# Patient Record
Sex: Female | Born: 1955 | Race: White | Hispanic: No | Marital: Married | Smoking: Former smoker
Health system: Southern US, Community
[De-identification: ages and names within clinical notes are randomized; demographics above are authoritative.]

## PROBLEM LIST (undated history)

## (undated) DIAGNOSIS — M67912 Unspecified disorder of synovium and tendon, left shoulder: Secondary | ICD-10-CM

## (undated) DIAGNOSIS — D649 Anemia, unspecified: Secondary | ICD-10-CM

## (undated) DIAGNOSIS — G2581 Restless legs syndrome: Secondary | ICD-10-CM

## (undated) DIAGNOSIS — K219 Gastro-esophageal reflux disease without esophagitis: Secondary | ICD-10-CM

## (undated) DIAGNOSIS — R9082 White matter disease, unspecified: Secondary | ICD-10-CM

## (undated) DIAGNOSIS — D699 Hemorrhagic condition, unspecified: Secondary | ICD-10-CM

## (undated) DIAGNOSIS — I10 Essential (primary) hypertension: Secondary | ICD-10-CM

## (undated) DIAGNOSIS — Z972 Presence of dental prosthetic device (complete) (partial): Secondary | ICD-10-CM

## (undated) DIAGNOSIS — T4145XA Adverse effect of unspecified anesthetic, initial encounter: Secondary | ICD-10-CM

## (undated) DIAGNOSIS — IMO0001 Reserved for inherently not codable concepts without codable children: Secondary | ICD-10-CM

## (undated) DIAGNOSIS — I2510 Atherosclerotic heart disease of native coronary artery without angina pectoris: Secondary | ICD-10-CM

## (undated) DIAGNOSIS — M1990 Unspecified osteoarthritis, unspecified site: Secondary | ICD-10-CM

## (undated) DIAGNOSIS — T8859XA Other complications of anesthesia, initial encounter: Secondary | ICD-10-CM

## (undated) DIAGNOSIS — R42 Dizziness and giddiness: Secondary | ICD-10-CM

## (undated) HISTORY — PX: TUBAL LIGATION: SHX77

## (undated) HISTORY — DX: White matter disease, unspecified: R90.82

## (undated) HISTORY — PX: CHOLECYSTECTOMY: SHX55

## (undated) HISTORY — DX: Essential (primary) hypertension: I10

## (undated) HISTORY — PX: FACIAL COSMETIC SURGERY: SHX629

## (undated) HISTORY — DX: Restless legs syndrome: G25.81

## (undated) HISTORY — DX: Anemia, unspecified: D64.9

## (undated) HISTORY — PX: FOOT SURGERY: SHX648

## (undated) HISTORY — DX: Gastro-esophageal reflux disease without esophagitis: K21.9

## (undated) HISTORY — PX: CARDIAC CATHETERIZATION: SHX172

## (undated) HISTORY — PX: RECTAL PROLAPSE REPAIR: SHX759

## (undated) HISTORY — DX: Reserved for inherently not codable concepts without codable children: IMO0001

---

## 2003-06-20 HISTORY — PX: GASTRIC BYPASS: SHX52

## 2003-11-05 ENCOUNTER — Other Ambulatory Visit: Payer: Self-pay

## 2004-05-08 ENCOUNTER — Emergency Department: Payer: Self-pay | Admitting: Emergency Medicine

## 2004-11-22 ENCOUNTER — Emergency Department: Payer: Self-pay | Admitting: Emergency Medicine

## 2005-06-09 ENCOUNTER — Ambulatory Visit: Payer: Self-pay | Admitting: Gastroenterology

## 2005-07-19 ENCOUNTER — Ambulatory Visit: Payer: Self-pay | Admitting: Anesthesiology

## 2005-08-13 ENCOUNTER — Emergency Department: Payer: Self-pay | Admitting: Emergency Medicine

## 2005-09-28 ENCOUNTER — Other Ambulatory Visit: Payer: Self-pay

## 2005-09-28 ENCOUNTER — Emergency Department: Payer: Self-pay | Admitting: Internal Medicine

## 2005-12-27 ENCOUNTER — Ambulatory Visit: Payer: Self-pay | Admitting: Anesthesiology

## 2006-06-14 ENCOUNTER — Ambulatory Visit: Payer: Self-pay | Admitting: Gastroenterology

## 2006-12-20 ENCOUNTER — Ambulatory Visit: Payer: Self-pay | Admitting: Internal Medicine

## 2007-09-03 ENCOUNTER — Ambulatory Visit: Payer: Self-pay | Admitting: Gastroenterology

## 2007-09-24 ENCOUNTER — Ambulatory Visit: Payer: Self-pay | Admitting: Unknown Physician Specialty

## 2007-10-07 ENCOUNTER — Ambulatory Visit: Payer: Self-pay | Admitting: Unknown Physician Specialty

## 2007-11-13 ENCOUNTER — Other Ambulatory Visit: Payer: Self-pay

## 2007-11-13 ENCOUNTER — Emergency Department: Payer: Self-pay | Admitting: Internal Medicine

## 2007-11-15 ENCOUNTER — Ambulatory Visit: Payer: Self-pay | Admitting: Internal Medicine

## 2008-06-19 HISTORY — PX: STOMACH SURGERY: SHX791

## 2008-09-30 ENCOUNTER — Ambulatory Visit: Payer: Self-pay | Admitting: Internal Medicine

## 2009-10-04 ENCOUNTER — Ambulatory Visit: Payer: Self-pay | Admitting: Internal Medicine

## 2010-12-02 ENCOUNTER — Ambulatory Visit: Payer: Self-pay | Admitting: Internal Medicine

## 2011-01-04 ENCOUNTER — Ambulatory Visit: Payer: Self-pay | Admitting: Neurology

## 2011-01-16 ENCOUNTER — Inpatient Hospital Stay: Payer: Self-pay | Admitting: Internal Medicine

## 2011-01-18 ENCOUNTER — Ambulatory Visit: Payer: Self-pay | Admitting: Internal Medicine

## 2011-01-25 ENCOUNTER — Ambulatory Visit: Payer: Self-pay | Admitting: Internal Medicine

## 2011-02-18 ENCOUNTER — Ambulatory Visit: Payer: Self-pay | Admitting: Internal Medicine

## 2011-03-22 ENCOUNTER — Ambulatory Visit: Payer: Self-pay | Admitting: Internal Medicine

## 2011-04-20 ENCOUNTER — Ambulatory Visit: Payer: Self-pay | Admitting: Internal Medicine

## 2011-05-16 ENCOUNTER — Ambulatory Visit: Payer: Self-pay | Admitting: Internal Medicine

## 2011-05-20 ENCOUNTER — Ambulatory Visit: Payer: Self-pay | Admitting: Internal Medicine

## 2011-06-26 ENCOUNTER — Ambulatory Visit: Payer: Self-pay | Admitting: Sports Medicine

## 2011-10-03 ENCOUNTER — Ambulatory Visit: Payer: Self-pay | Admitting: Internal Medicine

## 2011-10-03 LAB — CBC CANCER CENTER
Basophil %: 0.4 %
Eosinophil #: 0 x10 3/mm (ref 0.0–0.7)
Lymphocyte %: 28 %
MCH: 32.6 pg (ref 26.0–34.0)
MCV: 98 fL (ref 80–100)
Monocyte %: 7.1 %
Neutrophil #: 4 x10 3/mm (ref 1.4–6.5)
Neutrophil %: 63.9 %
Platelet: 219 x10 3/mm (ref 150–440)
RBC: 4.07 10*6/uL (ref 3.80–5.20)

## 2011-10-03 LAB — IRON AND TIBC: Iron Saturation: 59 %

## 2011-10-03 LAB — FERRITIN: Ferritin (ARMC): 19 ng/mL (ref 8–388)

## 2011-10-18 ENCOUNTER — Ambulatory Visit: Payer: Self-pay | Admitting: Internal Medicine

## 2011-12-15 ENCOUNTER — Ambulatory Visit: Payer: Self-pay | Admitting: Internal Medicine

## 2011-12-22 ENCOUNTER — Ambulatory Visit: Payer: Self-pay | Admitting: Internal Medicine

## 2012-05-24 ENCOUNTER — Ambulatory Visit: Payer: Self-pay | Admitting: Internal Medicine

## 2012-05-30 ENCOUNTER — Ambulatory Visit: Payer: Self-pay | Admitting: Internal Medicine

## 2012-07-18 ENCOUNTER — Ambulatory Visit: Payer: Self-pay | Admitting: Otolaryngology

## 2012-07-26 ENCOUNTER — Ambulatory Visit: Payer: Self-pay | Admitting: Internal Medicine

## 2012-09-12 ENCOUNTER — Ambulatory Visit: Payer: Self-pay | Admitting: Internal Medicine

## 2012-09-12 LAB — CBC CANCER CENTER
Basophil #: 0 x10 3/mm (ref 0.0–0.1)
Basophil %: 0.8 %
Eosinophil #: 0.1 x10 3/mm (ref 0.0–0.7)
HGB: 13.4 g/dL (ref 12.0–16.0)
Lymphocyte #: 1.7 x10 3/mm (ref 1.0–3.6)
Lymphocyte %: 31 %
MCH: 31.6 pg (ref 26.0–34.0)
MCHC: 33 g/dL (ref 32.0–36.0)
Neutrophil #: 3.2 x10 3/mm (ref 1.4–6.5)
Neutrophil %: 57.4 %
Platelet: 197 x10 3/mm (ref 150–440)
RBC: 4.25 10*6/uL (ref 3.80–5.20)
WBC: 5.6 x10 3/mm (ref 3.6–11.0)

## 2012-09-12 LAB — FERRITIN: Ferritin (ARMC): 12 ng/mL

## 2012-09-12 LAB — IRON AND TIBC
Iron Bind.Cap.(Total): 293 ug/dL
Iron Saturation: 42 %
Iron: 122 ug/dL
Unbound Iron-Bind.Cap.: 171 ug/dL

## 2012-09-17 ENCOUNTER — Ambulatory Visit: Payer: Self-pay | Admitting: Internal Medicine

## 2013-01-03 ENCOUNTER — Ambulatory Visit: Payer: Self-pay | Admitting: Gastroenterology

## 2013-02-18 ENCOUNTER — Ambulatory Visit: Payer: Self-pay | Admitting: Specialist

## 2013-08-02 ENCOUNTER — Emergency Department: Payer: Self-pay | Admitting: Emergency Medicine

## 2013-08-02 LAB — PROTIME-INR
INR: 1
PROTHROMBIN TIME: 12.7 s (ref 11.5–14.7)

## 2013-08-02 LAB — CBC
HCT: 36.8 % (ref 35.0–47.0)
HGB: 12.2 g/dL (ref 12.0–16.0)
MCH: 30.5 pg (ref 26.0–34.0)
MCHC: 33.3 g/dL (ref 32.0–36.0)
MCV: 92 fL (ref 80–100)
PLATELETS: 203 10*3/uL (ref 150–440)
RBC: 4.01 10*6/uL (ref 3.80–5.20)
RDW: 13.6 % (ref 11.5–14.5)
WBC: 5.5 10*3/uL (ref 3.6–11.0)

## 2013-08-02 LAB — BASIC METABOLIC PANEL
ANION GAP: 4 — AB (ref 7–16)
BUN: 12 mg/dL (ref 7–18)
CALCIUM: 9 mg/dL (ref 8.5–10.1)
CO2: 28 mmol/L (ref 21–32)
Chloride: 108 mmol/L — ABNORMAL HIGH (ref 98–107)
Creatinine: 1.01 mg/dL (ref 0.60–1.30)
EGFR (African American): >60
Glucose: 89 mg/dL (ref 65–99)
Osmolality: 279 (ref 275–301)
POTASSIUM: 3.9 mmol/L (ref 3.5–5.1)
SODIUM: 140 mmol/L (ref 136–145)

## 2013-08-02 LAB — TROPONIN I
Troponin-I: <0.02 ng/mL
Troponin-I: <0.02 ng/mL

## 2013-08-02 LAB — APTT: Activated PTT: 30.2 secs (ref 23.6–35.9)

## 2013-08-21 ENCOUNTER — Ambulatory Visit: Payer: Self-pay | Admitting: Internal Medicine

## 2013-08-21 LAB — IRON AND TIBC
IRON BIND. CAP.(TOTAL): 348 ug/dL (ref 250–450)
Iron Saturation: 22 %
Iron: 76 ug/dL (ref 50–170)
UNBOUND IRON-BIND. CAP.: 272 ug/dL

## 2013-08-21 LAB — CBC CANCER CENTER
Basophil #: 0 x10 3/mm (ref 0.0–0.1)
Basophil %: 0.7 %
EOS ABS: 0 x10 3/mm (ref 0.0–0.7)
EOS PCT: 0.4 %
HCT: 39.2 % (ref 35.0–47.0)
HGB: 12.9 g/dL (ref 12.0–16.0)
LYMPHS PCT: 29.9 %
Lymphocyte #: 1.9 x10 3/mm (ref 1.0–3.6)
MCH: 29.8 pg (ref 26.0–34.0)
MCHC: 33 g/dL (ref 32.0–36.0)
MCV: 90 fL (ref 80–100)
Monocyte #: 0.5 x10 3/mm (ref 0.2–0.9)
Monocyte %: 7.5 %
NEUTROS PCT: 61.5 %
Neutrophil #: 3.9 x10 3/mm (ref 1.4–6.5)
PLATELETS: 286 x10 3/mm (ref 150–440)
RBC: 4.35 10*6/uL (ref 3.80–5.20)
RDW: 13.3 % (ref 11.5–14.5)
WBC: 6.4 x10 3/mm (ref 3.6–11.0)

## 2013-08-21 LAB — FERRITIN: FERRITIN (ARMC): 7 ng/mL — AB (ref 8–388)

## 2013-09-17 ENCOUNTER — Ambulatory Visit: Payer: Self-pay | Admitting: Internal Medicine

## 2014-01-15 ENCOUNTER — Ambulatory Visit: Payer: Self-pay | Admitting: Internal Medicine

## 2014-01-15 LAB — IRON AND TIBC
IRON SATURATION: 56 %
IRON: 164 ug/dL (ref 50–170)
Iron Bind.Cap.(Total): 291 ug/dL (ref 250–450)
Unbound Iron-Bind.Cap.: 127 ug/dL

## 2014-01-15 LAB — FERRITIN: Ferritin (ARMC): 15 ng/mL (ref 8–388)

## 2014-01-17 ENCOUNTER — Ambulatory Visit: Payer: Self-pay | Admitting: Internal Medicine

## 2014-01-20 DIAGNOSIS — F410 Panic disorder [episodic paroxysmal anxiety] without agoraphobia: Secondary | ICD-10-CM | POA: Insufficient documentation

## 2014-01-20 DIAGNOSIS — I2510 Atherosclerotic heart disease of native coronary artery without angina pectoris: Secondary | ICD-10-CM | POA: Insufficient documentation

## 2014-02-14 DIAGNOSIS — D510 Vitamin B12 deficiency anemia due to intrinsic factor deficiency: Secondary | ICD-10-CM | POA: Insufficient documentation

## 2014-02-14 DIAGNOSIS — K219 Gastro-esophageal reflux disease without esophagitis: Secondary | ICD-10-CM | POA: Insufficient documentation

## 2014-02-14 DIAGNOSIS — G2581 Restless legs syndrome: Secondary | ICD-10-CM | POA: Insufficient documentation

## 2014-08-20 ENCOUNTER — Other Ambulatory Visit: Payer: Self-pay | Admitting: *Deleted

## 2014-08-20 ENCOUNTER — Encounter: Payer: Self-pay | Admitting: *Deleted

## 2014-08-20 ENCOUNTER — Ambulatory Visit (INDEPENDENT_AMBULATORY_CARE_PROVIDER_SITE_OTHER): Payer: BLUE CROSS/BLUE SHIELD

## 2014-08-20 ENCOUNTER — Encounter: Payer: Self-pay | Admitting: Podiatry

## 2014-08-20 ENCOUNTER — Ambulatory Visit (INDEPENDENT_AMBULATORY_CARE_PROVIDER_SITE_OTHER): Payer: BLUE CROSS/BLUE SHIELD | Admitting: Podiatry

## 2014-08-20 VITALS — BP 148/94 | HR 62 | Resp 16 | Ht 66.0 in | Wt 170.0 lb

## 2014-08-20 DIAGNOSIS — S9031XA Contusion of right foot, initial encounter: Secondary | ICD-10-CM | POA: Diagnosis not present

## 2014-08-20 DIAGNOSIS — S92911A Unspecified fracture of right toe(s), initial encounter for closed fracture: Secondary | ICD-10-CM | POA: Diagnosis not present

## 2014-08-20 MED ORDER — ACETAMINOPHEN-CODEINE #3 300-30 MG PO TABS
1.0000 | ORAL_TABLET | Freq: Three times a day (TID) | ORAL | Status: DC | PRN
Start: 2014-08-20 — End: 2017-07-18

## 2014-08-20 NOTE — Patient Instructions (Signed)

## 2014-08-20 NOTE — Progress Notes (Signed)
   Subjective:    Patient ID: Brittney Stephens, female    DOB: [birth year], 59 y.o.   MRN: [id number]  HPI Comments: 59 year old female presents the office today with complaints of right third toe pain. She states that she hit her right foot on the corner of a wall which resulted in pain to the third toe. She states that after she had immediate pain to the third toe. She states that she then developed pain over other areas of her foot. She states the areas swollen particularly of the right third toe. She has been taping the toe without much improvement. She states that she continues to have pain to the right foot. She denies any redness or any increase in warmth. On the way out she was stating that she had some discomfort to the left foot particularly the heel intermittently. She states that she does not have any pain currently at this time and she denies any history of injury or trauma. She denies any swelling or redness overlying the area. No other complaints at this time.      Review of Systems  All other systems reviewed and are negative.      Objective:   Physical Exam AAO x3, NAD DP/PT pulses palpable bilaterally, CRT less than 3 seconds Protective sensation intact with Simms Weinstein monofilament, vibratory sensation intact, Achilles tendon reflex intact There is tenderness palpation overlying the right third toe and there is edema to the digit. There is also tenderness palpation upon the second and third metatarsal. There is mild edema overlying the third MTPJ and the third digit. There is no overlying erythema or increase in warmth. There is no other areas of pinpoint bony tenderness to bilateral lower extremities. On the left side is no tenderness palpation overlying the calcaneus or along the course for insertion of the plantar fascia. No other areas of edema, erythema, increase in warmth to bilateral lower extremities. MMT 5/5, ROM WNL.  No open lesions bilaterally. On the plantar  aspect the left heel there is a small hyperkeratotic lesion along the medial aspect. Upon debridement there is no underlying ulceration, drainage or other clinical signs of infection. Scar overlying the right foot from prior surgery from prior bunionectomy which is well-healed. No pain with calf compression, swelling, warmth, erythema bilaterally.        Assessment & Plan:  59 year old female with right third toe fracture, foot contusion -X-rays were obtained of the right foot and discussed with the patient. There is a fracture of the third digit. -Treatment options were discussed with the patient include alternatives, risks, complications. -Due to the amount of discomfort we'll place the patient to a surgical shoe.  -Continue ice and elevation. -Prescribed Tylenol No. 3 due to pain. -Left plantar medial hyperkeratotic lesion sharply debrided without complication/bleeding. If the area remains symptomatic we'll discuss further treatment at next appointment. -Follow-up in 3 weeks or sooner if any problems are to arise. In the meantime, encouraged call the office with any questions, concerns, change in symptoms. Will x-ray right foot next appointment.

## 2014-09-10 ENCOUNTER — Ambulatory Visit: Payer: Self-pay | Admitting: Podiatry

## 2014-10-09 NOTE — Op Note (Signed)
DATE OF BIRTH:  [birth year]  DATE OF PROCEDURE:  07/18/2012  SURGEON:  Janalee Dane, MD    PREOPERATIVE DIAGNOSIS:  Aging face syndrome.   POSTOPERATIVE DIAGNOSIS:  Aging face syndrome.   PROCEDURE:  Combination laser procedure (profractional and micro laser peel).   DESCRIPTION OF THE PROCEDURE:  The patient was placed in the supine position on the operating room table. After general endotracheal anesthesia had been induced, the patient was turned 180 degrees from anesthesia, and the face was anesthetized with blocks at the infraorbital, mental, lateral orbital, premastoid, Erb's point. The face was then prepped with alcohol. After allowing the alcohol to dry, the profractional and MLP were used in the standard fashion using the settings as described in the diagram. The patient tolerated the procedure well. Her face was dressed with Aquaphor. She was allowed to emerge from anesthesia, extubated and taken to the recovery room in stable condition. There were no complications.   ESTIMATED BLOOD LOSS:  Less than 10 mL.    ____________________________ J. Nadeen Landau, MD jmc:ms D: 07/18/2012 20:46:13 ET T: 07/18/2012 23:04:20 ET JOB#: [id number]  cc: Janalee Dane, MD, <Dictator> Nicholos Johns MD ELECTRONICALLY SIGNED 07/30/2012 19:31

## 2015-07-21 DIAGNOSIS — K580 Irritable bowel syndrome with diarrhea: Secondary | ICD-10-CM | POA: Insufficient documentation

## 2015-07-21 DIAGNOSIS — D692 Other nonthrombocytopenic purpura: Secondary | ICD-10-CM | POA: Insufficient documentation

## 2015-07-21 DIAGNOSIS — I2510 Atherosclerotic heart disease of native coronary artery without angina pectoris: Secondary | ICD-10-CM | POA: Insufficient documentation

## 2016-01-25 ENCOUNTER — Emergency Department: Payer: BLUE CROSS/BLUE SHIELD

## 2016-01-25 ENCOUNTER — Inpatient Hospital Stay
Admission: EM | Admit: 2016-01-25 | Discharge: 2016-01-27 | DRG: 512 | Disposition: A | Discharge status: Home / Self Care | Payer: BLUE CROSS/BLUE SHIELD | Attending: Orthopedic Surgery | Admitting: Orthopedic Surgery

## 2016-01-25 DIAGNOSIS — I10 Essential (primary) hypertension: Secondary | ICD-10-CM | POA: Diagnosis present

## 2016-01-25 DIAGNOSIS — K219 Gastro-esophageal reflux disease without esophagitis: Secondary | ICD-10-CM | POA: Diagnosis present

## 2016-01-25 DIAGNOSIS — Y92008 Other place in unspecified non-institutional (private) residence as the place of occurrence of the external cause: Secondary | ICD-10-CM

## 2016-01-25 DIAGNOSIS — Z9889 Other specified postprocedural states: Secondary | ICD-10-CM

## 2016-01-25 DIAGNOSIS — S52502B Unspecified fracture of the lower end of left radius, initial encounter for open fracture type I or II: Secondary | ICD-10-CM | POA: Diagnosis present

## 2016-01-25 DIAGNOSIS — I2510 Atherosclerotic heart disease of native coronary artery without angina pectoris: Secondary | ICD-10-CM | POA: Diagnosis present

## 2016-01-25 DIAGNOSIS — Z79899 Other long term (current) drug therapy: Secondary | ICD-10-CM

## 2016-01-25 DIAGNOSIS — Z8781 Personal history of (healed) traumatic fracture: Secondary | ICD-10-CM

## 2016-01-25 DIAGNOSIS — D510 Vitamin B12 deficiency anemia due to intrinsic factor deficiency: Secondary | ICD-10-CM | POA: Diagnosis present

## 2016-01-25 DIAGNOSIS — W109XXA Fall (on) (from) unspecified stairs and steps, initial encounter: Secondary | ICD-10-CM | POA: Diagnosis present

## 2016-01-25 DIAGNOSIS — S52509A Unspecified fracture of the lower end of unspecified radius, initial encounter for closed fracture: Secondary | ICD-10-CM | POA: Diagnosis present

## 2016-01-25 DIAGNOSIS — Z87891 Personal history of nicotine dependence: Secondary | ICD-10-CM

## 2016-01-25 DIAGNOSIS — W19XXXA Unspecified fall, initial encounter: Secondary | ICD-10-CM

## 2016-01-25 DIAGNOSIS — S52612B Displaced fracture of left ulna styloid process, initial encounter for open fracture type I or II: Secondary | ICD-10-CM | POA: Diagnosis present

## 2016-01-25 MED ORDER — ZOLPIDEM TARTRATE 5 MG PO TABS: 5.0000 mg | ORAL_TABLET | Freq: Every evening | ORAL | Status: DC | PRN

## 2016-01-25 MED ORDER — SODIUM CHLORIDE 0.9 % IV SOLN
INTRAVENOUS | Status: DC
  Administered 2016-01-26 (×2): via INTRAVENOUS

## 2016-01-25 MED ORDER — CEFAZOLIN SODIUM-DEXTROSE 2-4 GM/100ML-% IV SOLN
2.0000 g | Freq: Three times a day (TID) | INTRAVENOUS | Status: DC
  Administered 2016-01-26 – 2016-01-27 (×4): 2 g via INTRAVENOUS
  Filled 2016-01-25 (×7): qty 100

## 2016-01-25 MED ORDER — OXYCODONE-ACETAMINOPHEN 5-325 MG PO TABS: 1.0000 | ORAL_TABLET | ORAL | Status: DC | PRN

## 2016-01-25 MED ORDER — MORPHINE SULFATE (PF) 2 MG/ML IV SOLN
2.0000 mg | INTRAVENOUS | Status: DC | PRN
Start: 2016-01-25 — End: 2016-01-26
  Administered 2016-01-25 (×2): 2 mg via INTRAVENOUS
  Filled 2016-01-25 (×2): qty 1

## 2016-01-25 MED ORDER — HYDROMORPHONE HCL 1 MG/ML IJ SOLN
INTRAMUSCULAR | Status: AC
  Administered 2016-01-26: 0.5 mg
  Filled 2016-01-25: qty 1

## 2016-01-25 MED ORDER — ONDANSETRON HCL 4 MG/2ML IJ SOLN
4.0000 mg | Freq: Four times a day (QID) | INTRAMUSCULAR | Status: DC | PRN
  Administered 2016-01-25 – 2016-01-26 (×4): 4 mg via INTRAVENOUS
  Filled 2016-01-25 (×3): qty 2

## 2016-01-25 MED ORDER — ONDANSETRON HCL 4 MG PO TABS
4.0000 mg | ORAL_TABLET | Freq: Four times a day (QID) | ORAL | Status: DC | PRN
Start: 2016-01-25 — End: 2016-01-27

## 2016-01-25 MED ORDER — OXYCODONE-ACETAMINOPHEN 5-325 MG PO TABS
2.0000 | ORAL_TABLET | Freq: Once | ORAL | Status: AC
  Administered 2016-01-25: 2 via ORAL
  Filled 2016-01-25: qty 2

## 2016-01-25 NOTE — ED Triage Notes (Signed)
Pt states that she has about a glass and a half of wine tonight and was walking down back porch.  Pt reports that she tripped in her flip flops down 3 stairs and fell onto concrete.  Per EMS, L wrist deformed and a puncture wound noted at scene.  Area bleeding.  Pt denies LOC and denies hitting head.

## 2016-01-25 NOTE — ED Notes (Signed)
Notified EDP of pain level and requesting pain medication for patient.  EDP gave no verbal orders at this time.

## 2016-01-25 NOTE — ED Notes (Signed)
All jewelry has been removed from affected hand

## 2016-01-25 NOTE — ED Notes (Signed)
Pt was transported to room 156. Staff on 1A notified of pt arrival

## 2016-01-25 NOTE — H&P (Signed)
Subjective:   Patient is a 60 y.o. female presents with eft wrist pain and deformity a fall at home while getting out of her back porch. She landed on her outstretched left hand had immediate pain in her family reported one was sticking out,out EMS reduced this wrapped with sterile dressing and then applied splint. She brought to the emergency room and evaluated with x-rays.  She will not let me remove the splint to check the wound because of severe pain. She is having a little bit of tingling into her hand.Right-hand-dominant. she had eaten shortly before the fall  Patient Active Problem List   Diagnosis Date Noted  . Distal radius fracture 01/25/2016  . 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 Acid reflux 02/14/2014  . Restless leg 02/14/2014  . Atherosclerosis of coronary artery 01/20/2014  . 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 Anemia   . Hypertension   . Reflux   . RLS (restless legs syndrome)     Past Surgical History:  Procedure Laterality Date  . FACIAL COSMETIC SURGERY    . FOOT SURGERY       (Not in a hospital admission) Allergies  Allergen Reactions  . Azithromycin Anaphylaxis    Other reaction(s): UNKNOWN  . Erythromycin Other (See Comments) and Shortness Of Breath    Other Reaction: Other reaction  . Iodine Anaphylaxis    Other reaction(s): RASH  . Shellfish Allergy Anaphylaxis    Other reaction(s): UNKNOWN    Social History  Substance Use Topics  . Smoking status: Former Research scientist (life sciences)  . Smokeless tobacco: Never Used     Comment: quit 5 years ago   . Alcohol use No    No family history on file.  Review of Systems Pertinent items are noted in HPI.  Objective:   Patient Vitals for the past 8 hrs:  BP Temp Temp src Pulse Resp SpO2 Height Weight  01/25/16 2050 (!) 166/93 98.2 F (36.8 C) Oral 91 18 97 % '5\' 6"'$  (1.676 m) 83.9 kg (185 lb)   No intake/output data recorded. No intake/output data recorded.    BP (!)  166/93   Pulse 91   Temp 98.2 F (36.8 C) (Oral)   Resp 18   Ht '5\' 6"'$  (1.676 m)   Wt 83.9 kg (185 lb)   SpO2 97%   BMI 29.86 kg/m  General appearance: alert, cooperative and moderate distress Head: Normocephalic, without obvious abnormality, atraumatic Lungs: clear to auscultation bilaterally Heart: regular rate and rhythm, S1, S2 normal, no murmur, click, rub or gallop Extremities: left wrist is in a EMS splint with dorsal dressing with a small amount of blood visible. She has brisk capillary refill.   Data Review x-rays reveal comminuted fracture volarly displaced distal radius fracture with minimally displaced distal ulna fracture  Assessment:   Active Problems:   Distal radius fracture   Plan:   ADMIT TO [facility] ORIF tomorrow after she has been npo Medical evaluation prior to surgery because of cardiac history

## 2016-01-25 NOTE — ED Notes (Signed)
$'100mg'X$  fentanyl given at approx 830pm by EMS per husband's estimation.

## 2016-01-25 NOTE — ED Provider Notes (Signed)
[facility] Emergency Department Provider Note  ____________________________________________  Time seen: Approximately 9:00 PM  I have reviewed the triage vital signs and the nursing notes.   HISTORY  Chief Complaint Fall    HPI Brittney Stephens is a 60 y.o. female , NAD, in severe pain, presents to the emergency department via EMS with left wrist pain. Patient is accompanied by her husband to assist with history. States the patient was walking down steps of her back deck when her flip flop broke and caused her to fall onto the concrete. States she put her arms out to brace for the fall. Noted severe pain in the left wrist and noted "a bone hanging out" with bleeding. Also notes some pain in the left elbow since being transported by EMS. Patient denies head injury, LOC, dizziness. EMS arrived to the scene and applied a splint as well as gave him 100 g of fentanyl as well as an antinausea medication. Patient denies any previous injuries to the left upper extremity.   Past Medical History:  Diagnosis Date  . Anemia   . Hypertension   . Reflux   . RLS (restless legs syndrome)     Patient Active Problem List   Diagnosis Date Noted  . Distal radius fracture 01/25/2016  . 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 Acid reflux 02/14/2014  . Restless leg 02/14/2014  . Atherosclerosis of coronary artery 01/20/2014  . Episodic paroxysmal anxiety disorder 01/20/2014    Past Surgical History:  Procedure Laterality Date  . FACIAL COSMETIC SURGERY    . FOOT SURGERY      Prior to Admission medications   Medication Sig Start Date End Date Taking? Authorizing Provider  acetaminophen-codeine (TYLENOL #3) 300-30 MG per tablet Take 1 tablet by mouth every 8 (eight) hours as needed for moderate pain. 08/20/14   Trula Slade, DPM  clopidogrel (PLAVIX) 75 MG tablet Take by mouth.    Historical Provider, MD  cyanocobalamin (,VITAMIN B-12,) 1000 MCG/ML injection Inject into the  muscle.    Historical Provider, MD  escitalopram (LEXAPRO) 10 MG tablet  07/18/14   Historical Provider, MD  hydrochlorothiazide (HYDRODIURIL) 12.5 MG tablet  11/30/13   Historical Provider, MD  HYDROcodone-acetaminophen (NORCO/VICODIN) 5-325 MG per tablet Take 1 tablet by mouth every 6 (six) hours as needed. for pain 05/19/14   Historical Provider, MD  lovastatin (MEVACOR) 10 MG tablet Take by mouth.    Historical Provider, MD  pantoprazole (PROTONIX) 40 MG tablet Take by mouth. 12/26/11   Historical Provider, MD    Allergies Azithromycin; Erythromycin; Iodine; and Shellfish allergy  No family history on file.  Social History Social History  Substance Use Topics  . Smoking status: Former Research scientist (life sciences)  . Smokeless tobacco: Never Used     Comment: quit 5 years ago   . Alcohol use No     Review of Systems  Constitutional: No fever/chills Eyes: No visual changes.  Cardiovascular: No chest pain. Respiratory: No shortness of breath.  Gastrointestinal: No abdominal pain.  No nausea, vomiting.  Musculoskeletal: Positive left wrist, forearm, elbow pain. Negative for back, neck pain.  Skin: Positive puncture left distal forearm with bleeding controlled. Negative for rash, redness, swelling, bruising. Neurological: Negative for headaches, focal weakness or numbness.No tingling, head injury, LOC, dizziness. 10-point ROS otherwise negative.  ____________________________________________   PHYSICAL EXAM:  VITAL SIGNS: ED Triage Vitals  Enc Vitals Group     BP 01/25/16 2050 (!) 166/93     Pulse Rate 01/25/16 2050  91     Resp 01/25/16 2050 18     Temp 01/25/16 2050 98.2 F (36.8 C)     Temp Source 01/25/16 2050 Oral     SpO2 01/25/16 2050 97 %     Weight 01/25/16 2050 185 lb (83.9 kg)     Height 01/25/16 2050 '5\' 6"'$  (1.676 m)     Head Circumference --      Peak Flow --      Pain Score 01/25/16 2051 10     Pain Loc --      Pain Edu? --      Excl. in [place]? --      Constitutional: Alert  and oriented. Well appearing and in no acute distress but in pain. Eyes: Conjunctivae are normal.  Head: Atraumatic. Cardiovascular: Good peripheral circulation with 2+ pulses noted in the left upper extremity. Respiratory: Normal respiratory effort without tachypnea or retractions.  Musculoskeletal: Left forearm placed in a splint and bandaging covering distal portion of the forearm. Patient is unable to move the left upper extremity without severe pain in the distal left forearm and wrist. Patient is able to wiggle her fingers but again with pain in the wrist. Neurologic:  Normal speech and language. No gross focal neurologic deficits are appreciated.  Skin:  Skin is warm, dry. No rash noted. Psychiatric: Mood and affect are normal. Speech and behavior are normal. Patient exhibits appropriate insight and judgement.   ____________________________________________   LABS (all labs ordered are listed, but only abnormal results are displayed)  Labs Reviewed  CBC WITH DIFFERENTIAL/PLATELET  BASIC METABOLIC PANEL   ____________________________________________  EKG  None ____________________________________________  RADIOLOGY I have personally viewed and evaluated these images (plain radiographs) as part of my medical decision making, as well as reviewing the written report by the radiologist.  Dg Elbow 2 Views Left  Result Date: 01/25/2016 CLINICAL DATA:  Pt states that she has about a glass and a half of wine tonight and was walking down back porch. Pt reports that she tripped in her flip flops down 3 stairs and fell onto concrete. Per EMS, L wrist deformed and a puncture wound noted at scene. EXAM: LEFT ELBOW - 2 VIEW COMPARISON:  None. FINDINGS: Two views of the elbow demonstrate no acute fracture. Splinting material overlies the mid forearm. IMPRESSION: No evidence for acute  abnormality at the elbow. Electronically Signed   By: Nolon Nations M.D.   On: 01/25/2016 21:36   Dg  Forearm Left  Result Date: 01/25/2016 CLINICAL DATA:  Pt states that she has about a glass and a half of wine tonight and was walking down back porch. Pt reports that she tripped in her flip flops down 3 stairs and fell onto concrete. Per EMS, L wrist deformed and a puncture wound noted at scene. EXAM: LEFT FOREARM - 2 VIEW COMPARISON:  None. FINDINGS: There is a comminuted fracture of the distal radius with radial and well lower displacement by 1/2 shaft with. The radiocarpal joint is normal. There is a comminuted fracture of the distal ulna with radiocarpal joint involvement suspected. Splinting material overlies the forearm. The proximal radius and ulna appear intact. IMPRESSION: Comminuted fractures of the distal radius and ulna. Electronically Signed   By: Nolon Nations M.D.   On: 01/25/2016 21:36   Dg Wrist Complete Left  Result Date: 01/25/2016 CLINICAL DATA:  Pt states that she has about a glass and a half of wine tonight and was walking down back porch. Pt reports  that she tripped in her flip flops down 3 stairs and fell onto concrete. Per EMS, L wrist deformed and a puncture wound noted at scene. EXAM: LEFT WRIST - COMPLETE 3+ VIEW COMPARISON:  None. FINDINGS: Wrist is viewed through plaster splinting material. Detail is obscured. There are comminuted fractures of distal radius and ulna. There is radial and volar displacement at the radial fracture by 1/2 shaft width. Comminuted distal ulnar fracture is also present. There is involvement of the distal radioulnar joint. The radiocarpal joint is relatively intact. IMPRESSION: Comminuted fractures of the distal radius and ulna. Electronically Signed   By: Nolon Nations M.D.   On: 01/25/2016 21:35    ____________________________________________    PROCEDURES  Procedure(s) performed: None   Procedures   Medications  0.9 %  sodium chloride infusion (not administered)  ceFAZolin (ANCEF) IVPB 2g/100 mL premix (not administered)   oxyCODONE-acetaminophen (PERCOCET/ROXICET) 5-325 MG per tablet 1-2 tablet (not administered)  morphine 2 MG/ML injection 2 mg (2 mg Intravenous Given 01/25/16 2206)  zolpidem (AMBIEN) tablet 5 mg (not administered)  ondansetron (ZOFRAN) tablet 4 mg ( Oral See Alternative 01/25/16 2205)    Or  ondansetron [facility]) injection 4 mg (4 mg Intravenous Given 01/25/16 2205)  oxyCODONE-acetaminophen (PERCOCET/ROXICET) 5-325 MG per tablet 2 tablet (2 tablets Oral Given 01/25/16 2140)     ____________________________________________   INITIAL IMPRESSION / ASSESSMENT AND PLAN / ED COURSE  Pertinent labs & imaging results that were available during my care of the patient were reviewed by me and considered in my medical decision making (see chart for details).  Clinical Course  Comment By Time  I spoke with Dr. Truitt Leep who is on-call for orthopedics in regards to the patient's presentation and imaging results. He will be admitting the patient to the hospital and will be completing surgery sometime tomorrow. He will be placing orders at this time and requests that all rings be removed from the patient's hand. Braxton Feathers, PA-C 08/08 2143    Patient's diagnosis is consistent with comminuted fractures of the distal radius and ulna. Patient will e admitted to the hospital under the care of Dr. Rudene Christians.        Braxton Feathers, PA-C 01/25/16 2212    Carrie Mew, MD 01/26/16 [phone]

## 2016-01-26 ENCOUNTER — Encounter
Admission: EM | Disposition: A | Discharge status: Home / Self Care | Payer: Self-pay | Source: Home / Self Care | Attending: Orthopedic Surgery

## 2016-01-26 ENCOUNTER — Inpatient Hospital Stay: Payer: BLUE CROSS/BLUE SHIELD | Admitting: Anesthesiology

## 2016-01-26 ENCOUNTER — Encounter: Payer: Self-pay | Admitting: *Deleted

## 2016-01-26 ENCOUNTER — Inpatient Hospital Stay: Payer: BLUE CROSS/BLUE SHIELD

## 2016-01-26 HISTORY — PX: OPEN REDUCTION INTERNAL FIXATION (ORIF) DISTAL RADIAL FRACTURE: SHX5989

## 2016-01-26 LAB — BASIC METABOLIC PANEL
ANION GAP: 10 (ref 5–15)
ANION GAP: 9 (ref 5–15)
BUN: 12 mg/dL (ref 6–20)
BUN: 11 mg/dL (ref 6–20)
CALCIUM: 9 mg/dL (ref 8.9–10.3)
CHLORIDE: 107 mmol/L (ref 101–111)
CHLORIDE: 105 mmol/L (ref 101–111)
CO2: 24 mmol/L (ref 22–32)
CO2: 25 mmol/L (ref 22–32)
CREATININE: 0.75 mg/dL (ref 0.44–1.00)
Calcium: 9.2 mg/dL (ref 8.9–10.3)
Creatinine, Ser: 0.71 mg/dL (ref 0.44–1.00)
GFR calc non Af Amer: >60 mL/min (ref >60)
GFR calc non Af Amer: >60 mL/min (ref >60)
Glucose, Bld: 90 mg/dL (ref 65–99)
Glucose, Bld: 134 mg/dL — ABNORMAL HIGH (ref 65–99)
POTASSIUM: 3.8 mmol/L (ref 3.5–5.1)
Potassium: 3.9 mmol/L (ref 3.5–5.1)
SODIUM: 141 mmol/L (ref 135–145)
SODIUM: 139 mmol/L (ref 135–145)

## 2016-01-26 LAB — CBC WITH DIFFERENTIAL/PLATELET
BASOS PCT: 1 %
Basophils Absolute: 0 10*3/uL (ref 0–0.1)
Basophils Absolute: 0.1 10*3/uL (ref 0–0.1)
Basophils Relative: 0 %
EOS ABS: 0 10*3/uL (ref 0–0.7)
Eosinophils Absolute: 0 10*3/uL (ref 0–0.7)
Eosinophils Relative: 0 %
Eosinophils Relative: 0 %
HEMATOCRIT: 40 % (ref 35.0–47.0)
HEMATOCRIT: 37.2 % (ref 35.0–47.0)
HEMOGLOBIN: 13.2 g/dL (ref 12.0–16.0)
HEMOGLOBIN: 12.8 g/dL (ref 12.0–16.0)
LYMPHS ABS: 1.6 10*3/uL (ref 1.0–3.6)
Lymphocytes Relative: 17 %
Lymphs Abs: 1.4 10*3/uL (ref 1.0–3.6)
MCH: 31.3 pg (ref 26.0–34.0)
MCH: 32.4 pg (ref 26.0–34.0)
MCHC: 33 g/dL (ref 32.0–36.0)
MCHC: 34.5 g/dL (ref 32.0–36.0)
MCV: 94.7 fL (ref 80.0–100.0)
MCV: 94 fL (ref 80.0–100.0)
MONOS PCT: 8 %
Monocytes Absolute: 0.8 10*3/uL (ref 0.2–0.9)
Monocytes Absolute: 0.6 10*3/uL (ref 0.2–0.9)
NEUTROS ABS: 6.2 10*3/uL (ref 1.4–6.5)
NEUTROS PCT: 74 %
Neutro Abs: 8.5 10*3/uL — ABNORMAL HIGH (ref 1.4–6.5)
Platelets: 219 10*3/uL (ref 150–440)
Platelets: 198 10*3/uL (ref 150–440)
RBC: 4.22 MIL/uL (ref 3.80–5.20)
RBC: 3.96 MIL/uL (ref 3.80–5.20)
RDW: 12.9 % (ref 11.5–14.5)
RDW: 13 % (ref 11.5–14.5)
WBC: 11 10*3/uL (ref 3.6–11.0)
WBC: 8.3 10*3/uL (ref 3.6–11.0)

## 2016-01-26 LAB — SURGICAL PCR SCREEN
MRSA, PCR: NEGATIVE
STAPHYLOCOCCUS AUREUS: NEGATIVE

## 2016-01-26 SURGERY — OPEN REDUCTION INTERNAL FIXATION (ORIF) DISTAL RADIUS FRACTURE
Anesthesia: General | Site: Wrist | Laterality: Left | Wound class: Contaminated

## 2016-01-26 MED ORDER — FENTANYL CITRATE (PF) 100 MCG/2ML IJ SOLN
25.0000 ug | INTRAMUSCULAR | Status: DC | PRN
  Administered 2016-01-26 (×3): 50 ug via INTRAVENOUS

## 2016-01-26 MED ORDER — OXYCODONE HCL 5 MG/5ML PO SOLN
5.0000 mg | Freq: Once | ORAL | Status: DC | PRN
Start: 2016-01-26 — End: 2016-01-26

## 2016-01-26 MED ORDER — HYDROMORPHONE HCL 1 MG/ML IJ SOLN
0.5000 mg | INTRAMUSCULAR | Status: DC | PRN
  Administered 2016-01-26 – 2016-01-27 (×11): 0.5 mg via INTRAVENOUS
  Filled 2016-01-26 (×12): qty 1

## 2016-01-26 MED ORDER — FENTANYL CITRATE (PF) 100 MCG/2ML IJ SOLN
INTRAMUSCULAR | Status: AC
  Administered 2016-01-26: 50 ug via INTRAVENOUS
  Filled 2016-01-26: qty 2

## 2016-01-26 MED ORDER — CLONIDINE HCL 0.1 MG PO TABS: 0.2000 mg | ORAL_TABLET | Freq: Once | ORAL | Status: DC

## 2016-01-26 MED ORDER — OXYCODONE HCL 5 MG PO TABS: 5.0000 mg | ORAL_TABLET | Freq: Once | ORAL | Status: DC | PRN

## 2016-01-26 MED ORDER — METOCLOPRAMIDE HCL 10 MG PO TABS: 5.0000 mg | ORAL_TABLET | Freq: Three times a day (TID) | ORAL | Status: DC | PRN

## 2016-01-26 MED ORDER — ACETAMINOPHEN 650 MG RE SUPP: 650.0000 mg | Freq: Four times a day (QID) | RECTAL | Status: DC | PRN

## 2016-01-26 MED ORDER — PRAVASTATIN SODIUM 20 MG PO TABS: 10.0000 mg | ORAL_TABLET | Freq: Every day | ORAL | Status: DC

## 2016-01-26 MED ORDER — FENTANYL CITRATE (PF) 100 MCG/2ML IJ SOLN
INTRAMUSCULAR | Status: DC | PRN
  Administered 2016-01-26 (×2): 100 ug via INTRAVENOUS

## 2016-01-26 MED ORDER — LACTATED RINGERS IV SOLN
INTRAVENOUS | Status: DC | PRN
  Administered 2016-01-26: 13:00:00 via INTRAVENOUS

## 2016-01-26 MED ORDER — SUCCINYLCHOLINE CHLORIDE 20 MG/ML IJ SOLN
INTRAMUSCULAR | Status: DC | PRN
  Administered 2016-01-26: 100 mg via INTRAVENOUS

## 2016-01-26 MED ORDER — DOCUSATE SODIUM 100 MG PO CAPS
100.0000 mg | ORAL_CAPSULE | Freq: Two times a day (BID) | ORAL | Status: DC
  Administered 2016-01-26 – 2016-01-27 (×2): 100 mg via ORAL
  Filled 2016-01-26 (×2): qty 1

## 2016-01-26 MED ORDER — BUPIVACAINE HCL (PF) 0.5 % IJ SOLN
INTRAMUSCULAR | Status: AC
  Filled 2016-01-26: qty 30

## 2016-01-26 MED ORDER — CLONIDINE HCL 0.2 MG/24HR TD PTWK
0.2000 mg | MEDICATED_PATCH | TRANSDERMAL | Status: DC
  Administered 2016-01-26: 0.2 mg via TRANSDERMAL
  Filled 2016-01-26: qty 1

## 2016-01-26 MED ORDER — BISACODYL 5 MG PO TBEC: 5.0000 mg | DELAYED_RELEASE_TABLET | Freq: Every day | ORAL | Status: DC | PRN

## 2016-01-26 MED ORDER — BUPIVACAINE HCL (PF) 0.5 % IJ SOLN
INTRAMUSCULAR | Status: DC | PRN
  Administered 2016-01-26: 10 mL

## 2016-01-26 MED ORDER — LIDOCAINE HCL (CARDIAC) 20 MG/ML IV SOLN
INTRAVENOUS | Status: DC | PRN
  Administered 2016-01-26: 60 mg via INTRAVENOUS

## 2016-01-26 MED ORDER — MIDAZOLAM HCL 2 MG/2ML IJ SOLN
INTRAMUSCULAR | Status: DC | PRN
  Administered 2016-01-26: 2 mg via INTRAVENOUS

## 2016-01-26 MED ORDER — NEOMYCIN-POLYMYXIN B GU 40-200000 IR SOLN
Status: DC | PRN
  Administered 2016-01-26: 2 mL

## 2016-01-26 MED ORDER — LABETALOL HCL 5 MG/ML IV SOLN
INTRAVENOUS | Status: DC | PRN
  Administered 2016-01-26: 5 mg via INTRAVENOUS

## 2016-01-26 MED ORDER — HYDROCHLOROTHIAZIDE 25 MG PO TABS
12.5000 mg | ORAL_TABLET | Freq: Every day | ORAL | Status: DC
  Administered 2016-01-27: 12.5 mg via ORAL
  Filled 2016-01-26: qty 1

## 2016-01-26 MED ORDER — MAGNESIUM HYDROXIDE 400 MG/5ML PO SUSP: 30.0000 mL | Freq: Every day | ORAL | Status: DC | PRN

## 2016-01-26 MED ORDER — ACETAMINOPHEN 325 MG PO TABS: 650.0000 mg | ORAL_TABLET | Freq: Four times a day (QID) | ORAL | Status: DC | PRN

## 2016-01-26 MED ORDER — NEOMYCIN-POLYMYXIN B GU 40-200000 IR SOLN
Status: AC
  Filled 2016-01-26: qty 2

## 2016-01-26 MED ORDER — PANTOPRAZOLE SODIUM 40 MG PO TBEC
40.0000 mg | DELAYED_RELEASE_TABLET | Freq: Every day | ORAL | Status: DC
  Administered 2016-01-27: 40 mg via ORAL
  Filled 2016-01-26: qty 1

## 2016-01-26 MED ORDER — ESCITALOPRAM OXALATE 10 MG PO TABS
10.0000 mg | ORAL_TABLET | Freq: Every day | ORAL | Status: DC
  Administered 2016-01-26: 10 mg via ORAL
  Filled 2016-01-26: qty 1

## 2016-01-26 MED ORDER — HYDROMORPHONE HCL 1 MG/ML IJ SOLN
INTRAMUSCULAR | Status: AC
  Administered 2016-01-25: 0.5 mg
  Filled 2016-01-26: qty 1

## 2016-01-26 MED ORDER — DEXAMETHASONE SODIUM PHOSPHATE 10 MG/ML IJ SOLN
INTRAMUSCULAR | Status: DC | PRN
  Administered 2016-01-26: 5 mg via INTRAVENOUS

## 2016-01-26 MED ORDER — CEFAZOLIN SODIUM 1 G IJ SOLR
INTRAMUSCULAR | Status: DC | PRN
  Administered 2016-01-26: 2 g via INTRAMUSCULAR

## 2016-01-26 MED ORDER — OXYCODONE HCL 5 MG PO TABS
5.0000 mg | ORAL_TABLET | ORAL | Status: DC | PRN
  Administered 2016-01-27: 10 mg via ORAL
  Filled 2016-01-26: qty 2

## 2016-01-26 MED ORDER — METOCLOPRAMIDE HCL 5 MG/ML IJ SOLN: 5.0000 mg | Freq: Three times a day (TID) | INTRAMUSCULAR | Status: DC | PRN

## 2016-01-26 MED ORDER — PROPOFOL 10 MG/ML IV BOLUS
INTRAVENOUS | Status: DC | PRN
  Administered 2016-01-26: 120 mg via INTRAVENOUS

## 2016-01-26 SURGICAL SUPPLY — 40 items
BANDAGE ACE 4X5 VEL STRL LF (GAUZE/BANDAGES/DRESSINGS) ×2 IMPLANT
BIT DRILL 2 FAST STEP (BIT) ×1 IMPLANT
BIT DRILL 2.5X4 QC (BIT) ×1 IMPLANT
CANISTER SUCT 1200ML W/VALVE (MISCELLANEOUS) ×2 IMPLANT
CHLORAPREP W/TINT 26ML (MISCELLANEOUS) ×2 IMPLANT
CUFF TOURN 18 STER (MISCELLANEOUS) IMPLANT
DRAPE FLUOR MINI C-ARM 54X84 (DRAPES) ×2 IMPLANT
ELECT REM PT RETURN 9FT ADLT (ELECTROSURGICAL) ×2
ELECTRODE REM PT RTRN 9FT ADLT (ELECTROSURGICAL) ×1 IMPLANT
GAUZE PETRO XEROFOAM 1X8 (MISCELLANEOUS) ×4 IMPLANT
GAUZE SPONGE 4X4 12PLY STRL (GAUZE/BANDAGES/DRESSINGS) ×2 IMPLANT
GLOVE BIOGEL PI IND STRL 9 (GLOVE) ×1 IMPLANT
GLOVE BIOGEL PI INDICATOR 9 (GLOVE) ×1
GLOVE SURG ORTHO 9.0 STRL STRW (GLOVE) ×2 IMPLANT
GOWN SRG 2XL LVL 4 RGLN SLV (GOWNS) ×1 IMPLANT
GOWN STRL NON-REIN 2XL LVL4 (GOWNS) ×2
GOWN STRL REUS W/ TWL LRG LVL3 (GOWN DISPOSABLE) ×1 IMPLANT
GOWN STRL REUS W/TWL LRG LVL3 (GOWN DISPOSABLE) ×2
KIT RM TURNOVER STRD PROC AR (KITS) ×2 IMPLANT
NDL FILTER BLUNT 18X1 1/2 (NEEDLE) ×1 IMPLANT
NEEDLE FILTER BLUNT 18X 1/2SAF (NEEDLE) ×1
NEEDLE FILTER BLUNT 18X1 1/2 (NEEDLE) ×1 IMPLANT
NS IRRIG 500ML POUR BTL (IV SOLUTION) ×2 IMPLANT
PACK EXTREMITY ARMC (MISCELLANEOUS) ×2 IMPLANT
PAD CAST CTTN 4X4 STRL (SOFTGOODS) ×2 IMPLANT
PADDING CAST COTTON 4X4 STRL (SOFTGOODS) ×2
PEG SUBCHONDRAL SMOOTH 2.0X14 (Peg) ×1 IMPLANT
PEG SUBCHONDRAL SMOOTH 2.0X18 (Peg) ×1 IMPLANT
PEG SUBCHONDRAL SMOOTH 2.0X20 (Peg) ×2 IMPLANT
PEG SUBCHONDRAL SMOOTH 2.0X22 (Peg) ×2 IMPLANT
PLATE SHORT 21.6X48.9 NRRW LT (Plate) ×1 IMPLANT
SCREW CORT 3.5X10 LNG (Screw) ×3 IMPLANT
SPLINT CAST 1 STEP 3X12 (MISCELLANEOUS) ×2 IMPLANT
STOCKINETTE 48X4 2 PLY STRL (GAUZE/BANDAGES/DRESSINGS) ×1 IMPLANT
STOCKINETTE STRL 4IN 9604848 (GAUZE/BANDAGES/DRESSINGS) ×2 IMPLANT
SUT ETHILON 4-0 (SUTURE) ×2
SUT ETHILON 4-0 FS2 18XMFL BLK (SUTURE) ×1
SUT VICRYL 3-0 27IN (SUTURE) ×2 IMPLANT
SUTURE ETHLN 4-0 FS2 18XMF BLK (SUTURE) ×1 IMPLANT
SYR 3ML LL SCALE MARK (SYRINGE) ×2 IMPLANT

## 2016-01-26 NOTE — Anesthesia Procedure Notes (Signed)
Procedure Name: Intubation Performed by: Letitia Neri Pre-anesthesia Checklist: Patient identified, Emergency Drugs available, Suction available, Patient being monitored and Timeout performed Patient Re-evaluated:Patient Re-evaluated prior to inductionOxygen Delivery Method: Circle system utilized Preoxygenation: Pre-oxygenation with 100% oxygen Intubation Type: IV induction Ventilation: Mask ventilation without difficulty Laryngoscope Size: Mac and 3 Grade View: Grade III Tube type: Oral Tube size: 7.0 mm Number of attempts: 2 (DL X 1 with MAC 3, grade III view, unable to successfully pass ETT through VC.  Second DL with MAC 3, grade III view, successful intubation over bougie.) Airway Equipment and Method: Bougie stylet Placement Confirmation: ETT inserted through vocal cords under direct vision,  positive ETCO2 and breath sounds checked- equal and bilateral Secured at: 21 cm Tube secured with: Tape Dental Injury: Teeth and Oropharynx as per pre-operative assessment  Difficulty Due To: Difficulty was anticipated

## 2016-01-26 NOTE — Anesthesia Postprocedure Evaluation (Signed)
Anesthesia Post Note  Patient: Brittney Stephens  Procedure(s) Performed: Procedure(s) (LRB): OPEN REDUCTION INTERNAL FIXATION (ORIF) DISTAL RADIAL FRACTURE (Left)  Patient location during evaluation: PACU Anesthesia Type: General Level of consciousness: awake and alert Pain management: pain level controlled Vital Signs Assessment: post-procedure vital signs reviewed and stable Respiratory status: spontaneous breathing, nonlabored ventilation, respiratory function stable and patient connected to nasal cannula oxygen Cardiovascular status: blood pressure returned to baseline and stable Postop Assessment: no signs of nausea or vomiting Anesthetic complications: no    Last Vitals:  Vitals:   01/26/16 1448 01/26/16 1538  BP: (!) 162/93 (!) 182/102  Pulse: 77 86  Resp: 12 18  Temp: 36.8 C 36.8 C    Last Pain:  Vitals:   01/26/16 1538  TempSrc: Oral  PainSc:                  Precious Haws Marquelle Balow

## 2016-01-26 NOTE — Anesthesia Preprocedure Evaluation (Signed)
Anesthesia Evaluation  Patient identified by MRN, date of birth, ID band Patient awake    Reviewed: Allergy & Precautions, H&P , NPO status , Patient's Chart, lab work & pertinent test results  History of Anesthesia Complications Negative for: history of anesthetic complications  Airway Mallampati: III  TM Distance: >3 FB Neck ROM: limited    Dental  (+) Poor Dentition, Chipped   Pulmonary neg shortness of breath, former smoker,    Pulmonary exam normal breath sounds clear to auscultation       Cardiovascular Exercise Tolerance: Good hypertension, (-) angina+ CAD  (-) Past MI, (-) Cardiac Stents and (-) DOE Normal cardiovascular exam Rhythm:regular Rate:Normal     Neuro/Psych PSYCHIATRIC DISORDERS Anxiety negative neurological ROS     GI/Hepatic Neg liver ROS, GERD  Controlled,  Endo/Other  negative endocrine ROS  Renal/GU negative Renal ROS  negative genitourinary   Musculoskeletal   Abdominal   Peds  Hematology negative hematology ROS (+)   Anesthesia Other Findings Past Medical History: No date: Anemia No date: Hypertension No date: Reflux No date: RLS (restless legs syndrome)  Past Surgical History: No date: FACIAL COSMETIC SURGERY No date: FOOT SURGERY  BMI    Body Mass Index:  29.86 kg/m      Reproductive/Obstetrics negative OB ROS                             Anesthesia Physical Anesthesia Plan  ASA: III  Anesthesia Plan: General ETT   Post-op Pain Management:    Induction:   Airway Management Planned:   Additional Equipment:   Intra-op Plan:   Post-operative Plan:   Informed Consent: I have reviewed the patients History and Physical, chart, labs and discussed the procedure including the risks, benefits and alternatives for the proposed anesthesia with the patient or authorized representative who has indicated his/her understanding and acceptance.    Dental Advisory Given  Plan Discussed with: Anesthesiologist, CRNA and Surgeon  Anesthesia Plan Comments:         Anesthesia Quick Evaluation

## 2016-01-26 NOTE — Progress Notes (Signed)
Patient seen this a.m. and been having quite a bit of pain overnight. She is having persistent tingling in her thumb and index finger consistent with carpal tunnel syndrome related to her fracture and will add carpal tunnel release to her surgery.

## 2016-01-26 NOTE — Progress Notes (Signed)
Pt. In severe pain 10 out of 10. Dr. Rudene Christians ordered dilaudid 0.'5mg'$  IV I3K prn

## 2016-01-26 NOTE — Op Note (Signed)
01/25/2016 - 01/26/2016  2:01 PM  PATIENT:  Brittney Stephens  60 y.o. female  PRE-OPERATIVE DIAGNOSIS:  wrist fracture left open grade 2, acute carpal tunnel syndrome  POST-OPERATIVE DIAGNOSIS: Same  PROCEDURE:  Procedure(s): OPEN REDUCTION INTERNAL FIXATION (ORIF) DISTAL RADIAL FRACTURE (Left), left carpal tunnel release, irrigation debridement open fracture  SURGEON: Laurene Footman, MD  ASSISTANTS: None  ANESTHESIA:   general  EBL:  Total I/O In: 500 [I.V.:500] Out: 10 [Blood:10]  BLOOD ADMINISTERED:none  DRAINS: none   LOCAL MEDICATIONS USED:  MARCAINE     SPECIMEN:  No Specimen  DISPOSITION OF SPECIMEN:  N/A  COUNTS:  YES  TOURNIQUET:   33 minutes at 250 mms mercury  IMPLANTS: [employer] short narrow DVR plate with multiple screws and pegs  DICTATION: .Dragon Dictation patient was brought the operating room and after adequate anesthesia was obtained, the left arm was prepped and draped using sterile fashion. After patient identification and timeout procedures were completed the open fracture was debrided with extension of the dorsal incision from the lateral laceration the wound was thoroughly irrigated there were loose bone fragments which were debrided with use of a bone bony rongeur as not as well noted a at the EPL tendon had some abrasions to it and the tendon was debrided as well with scissors. After irrigating this open fracture the attention was turned volarly with traction applied the fracture reduced fairly well a volar approach is made centered over the FCR tendon with a tourniquet raised and the fracture exposed after lifting up the pronator off the fracture the fractured and displaced volarly and so with the volar plate applied the fracture reduced well 3 cortical screws were placed followed by multiple pegs to maintain alignment. Traction was released and under fluoroscopic views the fracture was stable. The volar incision was then irrigated and  closed with 3-0 Vicryl and 4-0 nylon for skin. Going to the carpal tunnel release approximately 1 inch incision was made in line with ring metacarpal the transverse carpal ligament was identified and opened with a small hemostat underneath to protect the underlying structures release care approximately and distally to provide relief of pressure from an acute carpal tunnel. Going back dorsally the dorsal all wound was thoroughly irrigated and there were no loose fragments after having debrided the bone dorsally next the wounds were closed volarly with 4-0 nylon dorsal incision loosely closed with simple 2 simple interrupted sutures laceration selves proximally 3 cm in length with an associated skin tear. Xeroform 4 x 4 web roll and Ace wrap were applied with a volar splint after infiltrating the carpal tunnel incision with 10 cc of half percent Sensorcaine to aid in postop analgesia  PLAN OF CARE: Continue as inpatient  PATIENT DISPOSITION:  PACU - hemodynamically stable.

## 2016-01-26 NOTE — Transfer of Care (Signed)
Immediate Anesthesia Transfer of Care Note  Patient: Brittney Stephens  Procedure(s) Performed: Procedure(s): OPEN REDUCTION INTERNAL FIXATION (ORIF) DISTAL RADIAL FRACTURE (Left)  Patient Location: PACU  Anesthesia Type:General  Level of Consciousness: sedated  Airway & Oxygen Therapy: Patient Spontanous Breathing and Patient connected to face mask oxygen  Post-op Assessment: Report given to RN and Post -op Vital signs reviewed and stable  Post vital signs: Reviewed and stable  Last Vitals:  Vitals:   01/26/16 0754 01/26/16 1138  BP: (!) 193/92 (!) 193/90  Pulse: 77 71  Resp: 18 18  Temp: 36.4 C 36.4 C    Last Pain:  Vitals:   01/26/16 1138  TempSrc: Tympanic  PainSc: 6       Patients Stated Pain Goal: 2 (64/33/29 5188)  Complications: No apparent anesthesia complications

## 2016-01-27 ENCOUNTER — Encounter: Payer: Self-pay | Admitting: Orthopedic Surgery

## 2016-01-27 MED ORDER — ONDANSETRON HCL 4 MG PO TABS: 4.0000 mg | ORAL_TABLET | Freq: Four times a day (QID) | ORAL | 0 refills | Status: DC | PRN

## 2016-01-27 MED ORDER — OXYCODONE-ACETAMINOPHEN 5-325 MG PO TABS: 1.0000 | ORAL_TABLET | ORAL | 0 refills | Status: DC | PRN

## 2016-01-27 NOTE — Discharge Instructions (Signed)
Diet: As you were doing prior to hospitalization   Shower:  May shower but keep the wounds dry, use an occlusive plastic wrap, NO SOAKING IN TUB.  If the bandage gets wet, change with a clean dry gauze.  Dressing:  Keep dressing clean and dry   Activity:  Increase activity slowly as tolerated, but follow the weight bearing instructions below.  No lifting or driving for 6 weeks.  Weight Bearing:   Non-weight bearing left wrist  To prevent constipation: you may use a stool softener such as -  Colace (over the counter) 100 mg by mouth twice a day  Drink plenty of fluids (prune juice may be helpful) and high fiber foods Miralax (over the counter) for constipation as needed.    Itching:  If you experience itching with your medications, try taking only a single pain pill, or even half a pain pill at a time.  You may take up to 10 pain pills per day, and you can also use benadryl over the counter for itching or also to help with sleep.   Precautions:  If you experience chest pain or shortness of breath - call 911 immediately for transfer to the hospital emergency department!!  If you develop a fever greater that 101 F, purulent drainage from wound, increased redness or drainage from wound, or calf pain-Call [facility]                                              Follow- Up Appointment:  Please call for an appointment to be seen in 4 days at [facility]

## 2016-01-27 NOTE — Progress Notes (Signed)
Discharge summary reviewed with patient with understanding. Narcotic Rx given upon discharge. Awaiting spouse to transport. VSS at this time, All morning meds given, BP addressed

## 2016-01-27 NOTE — Discharge Summary (Signed)
Physician Discharge Summary  Patient ID: Brittney Stephens MRN: [id number] DOB/AGE: 10-18-1955 60 y.o.  Admit date: 01/25/2016 Discharge date: 01/27/2016  Admission Diagnoses:  Fall [W19.XXXA]   Discharge Diagnoses: Patient Active Problem List   Diagnosis Date Noted  . Distal radius fracture 01/25/2016  . 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 Acid reflux 02/14/2014  . Restless leg 02/14/2014  . Atherosclerosis of coronary artery 01/20/2014  . Episodic paroxysmal anxiety disorder 01/20/2014    Past Medical History:  Diagnosis Date  . Anemia   . Difficult intubation   . Hypertension   . Reflux   . RLS (restless legs syndrome)      Transfusion: none   Consultants (if any): Treatment Team:  Hessie Knows, MD  Discharged Condition: Improved  Hospital Course: Brittney Stephens is an 60 y.o. female who was admitted 01/25/2016 with a diagnosis of left wrist open fracture with Carpal tunnel syndrome and went to the operating room on 01/25/2016 - 01/26/2016 and underwent the above named procedures.    Surgeries: Procedure(s): OPEN REDUCTION INTERNAL FIXATION (ORIF) DISTAL RADIAL FRACTURE on 01/25/2016 - 01/26/2016 Patient tolerated the surgery well. Taken to PACU where she was stabilized and then transferred to the orthopedic floor.   Patient's IV was d/c on day #1. Pain well controlled. VSS. IV antibiotics administered. On post op day #1 patient was stable and ready for discharge to home.  Implants: [employer] short narrow DVR plate with multiple screws and pegs  She was given perioperative antibiotics:  Anti-infectives    Start     Dose/Rate Route Frequency Ordered Stop   01/25/16 2200  ceFAZolin (ANCEF) IVPB 2g/100 mL premix     2 g 200 mL/hr over 30 Minutes Intravenous Every 8 hours 01/25/16 2149      .  She benefited maximally from the hospital stay and there were no complications.    Recent vital signs:  Vitals:   01/27/16 0342 01/27/16 0806  BP: 137/74 (!) 154/97   Pulse: 75 71  Resp: 18 16  Temp: 98.2 F (36.8 C) 98.2 F (36.8 C)    Recent laboratory studies:  Lab Results  Component Value Date   HGB 12.8 01/26/2016   HGB 13.2 01/25/2016   HGB 12.9 08/21/2013   Lab Results  Component Value Date   WBC 8.3 01/26/2016   PLT 198 01/26/2016   Lab Results  Component Value Date   INR 1.0 08/02/2013   Lab Results  Component Value Date   NA 139 01/26/2016   K 3.9 01/26/2016   CL 105 01/26/2016   CO2 25 01/26/2016   BUN 11 01/26/2016   CREATININE 0.71 01/26/2016   GLUCOSE 134 (H) 01/26/2016    Discharge Medications:     Medication List    STOP taking these medications   HYDROcodone-acetaminophen 5-325 MG tablet Commonly known as:  NORCO/VICODIN     TAKE these medications   acetaminophen-codeine 300-30 MG tablet Commonly known as:  TYLENOL #3 Take 1 tablet by mouth every 8 (eight) hours as needed for moderate pain.   clopidogrel 75 MG tablet Commonly known as:  PLAVIX Take by mouth.   cyanocobalamin 1000 MCG/ML injection Commonly known as:  (VITAMIN B-12) Inject into the muscle.   escitalopram 10 MG tablet Commonly known as:  LEXAPRO   hydrochlorothiazide 12.5 MG tablet Commonly known as:  HYDRODIURIL   lovastatin 10 MG tablet Commonly known as:  MEVACOR Take by mouth.   ondansetron 4 MG tablet Commonly known as:  ZOFRAN Take 1 tablet (4 mg total) by mouth every 6 (six) hours as needed for nausea.   oxyCODONE-acetaminophen 5-325 MG tablet Commonly known as:  PERCOCET/ROXICET Take 1-2 tablets by mouth every 4 (four) hours as needed for moderate pain.   pantoprazole 40 MG tablet Commonly known as:  PROTONIX Take by mouth.       Diagnostic Studies: Dg Elbow 2 Views Left  Result Date: 01/25/2016 CLINICAL DATA:  Pt states that she has about a glass and a half of wine tonight and was walking down back porch. Pt reports that she tripped in her flip flops down 3 stairs and fell onto concrete. Per EMS, L wrist  deformed and a puncture wound noted at scene. EXAM: LEFT ELBOW - 2 VIEW COMPARISON:  None. FINDINGS: Two views of the elbow demonstrate no acute fracture. Splinting material overlies the mid forearm. IMPRESSION: No evidence for acute  abnormality at the elbow. Electronically Signed   By: Nolon Nations M.D.   On: 01/25/2016 21:36   Dg Forearm Left  Result Date: 01/25/2016 CLINICAL DATA:  Pt states that she has about a glass and a half of wine tonight and was walking down back porch. Pt reports that she tripped in her flip flops down 3 stairs and fell onto concrete. Per EMS, L wrist deformed and a puncture wound noted at scene. EXAM: LEFT FOREARM - 2 VIEW COMPARISON:  None. FINDINGS: There is a comminuted fracture of the distal radius with radial and well lower displacement by 1/2 shaft with. The radiocarpal joint is normal. There is a comminuted fracture of the distal ulna with radiocarpal joint involvement suspected. Splinting material overlies the forearm. The proximal radius and ulna appear intact. IMPRESSION: Comminuted fractures of the distal radius and ulna. Electronically Signed   By: Nolon Nations M.D.   On: 01/25/2016 21:36   Dg Wrist 2 Views Left  Result Date: 01/26/2016 CLINICAL DATA:  Postop left wrist fracture EXAM: LEFT WRIST - 2 VIEW COMPARISON:  01/25/2016 FINDINGS: Two views of the left wrist submitted. Study is limited by casting material artifact and diffuse osteopenia. There is comminuted fracture in distal left ulna with improvement in alignment. A metallic fixation plate and metallic fixation screws in distal left radius. There is anatomic alignment of distal radial fracture post fixation. Mild displaced fracture of ulnar-styloid. IMPRESSION: Study is limited by casting material artifact and diffuse osteopenia. There is comminuted fracture in distal left ulna with improvement in alignment. A metallic fixation plate and metallic fixation screws in distal left radius. There is anatomic  alignment of distal radial fracture post fixation. Mild displaced fracture of ulnar-styloid. Electronically Signed   By: Lahoma Crocker M.D.   On: 01/26/2016 14:59   Dg Wrist Complete Left  Result Date: 01/25/2016 CLINICAL DATA:  Pt states that she has about a glass and a half of wine tonight and was walking down back porch. Pt reports that she tripped in her flip flops down 3 stairs and fell onto concrete. Per EMS, L wrist deformed and a puncture wound noted at scene. EXAM: LEFT WRIST - COMPLETE 3+ VIEW COMPARISON:  None. FINDINGS: Wrist is viewed through plaster splinting material. Detail is obscured. There are comminuted fractures of distal radius and ulna. There is radial and volar displacement at the radial fracture by 1/2 shaft width. Comminuted distal ulnar fracture is also present. There is involvement of the distal radioulnar joint. The radiocarpal joint is relatively intact. IMPRESSION: Comminuted fractures of the distal radius and  ulna. Electronically Signed   By: Nolon Nations M.D.   On: 01/25/2016 21:35    Disposition:     Follow-up Information    MENZ,MICHAEL, MD Follow up on 01/31/2016.   Specialty:  Orthopedic Surgery Contact information: [place] [id number] [phone]            Signed: Feliberto Gottron 01/27/2016, 8:21 AM

## 2016-01-27 NOTE — Progress Notes (Signed)
   Subjective: 1 Day Post-Op Procedure(s) (LRB): OPEN REDUCTION INTERNAL FIXATION (ORIF) DISTAL RADIAL FRACTURE (Left) Patient reports pain as mild.   Patient is well, and has had no acute complaints or problems. Numbness has improved, only minimal numbness left thumb. Denies any CP, SOB, ABD pain. We will continue therapy today.  Plan is to go Home after hospital stay.  Objective: Vital signs in last 24 hours: Temp:  [97 F (36.1 C)-98.9 F (37.2 C)] 98.2 F (36.8 C) (08/10 0806) Pulse Rate:  [71-89] 71 (08/10 0806) Resp:  [12-20] 16 (08/10 0806) BP: (136-194)/(74-104) 154/97 (08/10 0806) SpO2:  [91 %-100 %] 100 % (08/10 0806)  Intake/Output from previous day: 08/09 0701 - 08/10 0700 In: 2092.5 [P.O.:360; I.V.:1632.5; IV Piggyback:100] Out: 810 [Urine:800; Blood:10] Intake/Output this shift: No intake/output data recorded.   Recent Labs  01/25/16 2218 01/26/16 0421  HGB 13.2 12.8    Recent Labs  01/25/16 2218 01/26/16 0421  WBC 11.0 8.3  RBC 4.22 3.96  HCT 40.0 37.2  PLT 219 198    Recent Labs  01/25/16 2218 01/26/16 0421  NA 141 139  K 3.8 3.9  CL 107 105  CO2 24 25  BUN 12 11  CREATININE 0.75 0.71  GLUCOSE 90 134*  CALCIUM 9.2 9.0   No results for input(s): LABPT, INR in the last 72 hours.  EXAM General - Patient is Alert, Appropriate and Oriented Left Upper Extremity - Neurovascular intact Sensation intact distally Intact pulses distally No cellulitis present Compartment soft Dressing - dressing C/D/I and no drainage Motor Function - Patient able to make a fist. Slightly limited ROM of the digits.  Past Medical History:  Diagnosis Date  . Anemia   . Difficult intubation   . Hypertension   . Reflux   . RLS (restless legs syndrome)     Assessment/Plan:   1 Day Post-Op Procedure(s) (LRB): OPEN REDUCTION INTERNAL FIXATION (ORIF) DISTAL RADIAL FRACTURE (Left) Active Problems:   Distal radius fracture  Estimated body mass index is  29.86 kg/m as calculated from the following:   Height as of this encounter: '5\' 6"'$  (1.676 m).   Weight as of this encounter: 83.9 kg (185 lb). Advance diet  Pain controlled. VSS Plan on discharge to home today Follow up with [facility] in 4 days    T. Rachelle Hora, PA-C [place] 01/27/2016, 8:27 AM

## 2016-01-27 NOTE — Care Management Note (Signed)
Case Management Note  Patient Details  Name: Brittney Stephens MRN: [id number] Date of Birth: February 04, 1956  Subjective/Objective:     Spoke with patient who is from home with husband and both are independent. Patient does not use any DME and stated she ambulates fine. Husband is able to assist her at home. Denies any issues with medications. Has PCP. No Cm needs identified.               Action/Plan:Home with self care , Will sign off.   Expected Discharge Date:  01/26/16               Expected Discharge Plan:  Home/Self Care  In-House Referral:     Discharge planning Services  CM Consult  Post Acute Care Choice:    Choice offered to:     DME Arranged:    DME Agency:     HH Arranged:    HH Agency:     Status of Service:  Completed, signed off  If discussed at [employer] of Stay Meetings, dates discussed:    Additional Comments:  Alvie Heidelberg, RN 01/27/2016, 9:20 AM

## 2016-05-26 ENCOUNTER — Other Ambulatory Visit: Payer: Self-pay | Admitting: Neurology

## 2016-05-26 DIAGNOSIS — R296 Repeated falls: Secondary | ICD-10-CM

## 2016-06-15 ENCOUNTER — Ambulatory Visit: Payer: Self-pay

## 2016-06-15 ENCOUNTER — Ambulatory Visit: Payer: BLUE CROSS/BLUE SHIELD

## 2016-07-25 ENCOUNTER — Ambulatory Visit
Admission: RE | Admit: 2016-07-25 | Discharge: 2016-07-25 | Disposition: A | Discharge status: Home / Self Care | Payer: BLUE CROSS/BLUE SHIELD | Source: Ambulatory Visit | Attending: Neurology | Admitting: Neurology

## 2016-07-25 ENCOUNTER — Other Ambulatory Visit
Admission: RE | Admit: 2016-07-25 | Discharge: 2016-07-25 | Disposition: A | Discharge status: Home / Self Care | Payer: BLUE CROSS/BLUE SHIELD | Source: Ambulatory Visit | Attending: Neurology | Admitting: Neurology

## 2016-07-25 DIAGNOSIS — R202 Paresthesia of skin: Secondary | ICD-10-CM | POA: Diagnosis not present

## 2016-07-25 DIAGNOSIS — R296 Repeated falls: Secondary | ICD-10-CM | POA: Insufficient documentation

## 2016-07-25 DIAGNOSIS — M47812 Spondylosis without myelopathy or radiculopathy, cervical region: Secondary | ICD-10-CM | POA: Insufficient documentation

## 2016-07-25 DIAGNOSIS — R531 Weakness: Secondary | ICD-10-CM | POA: Insufficient documentation

## 2016-07-25 DIAGNOSIS — R9082 White matter disease, unspecified: Secondary | ICD-10-CM | POA: Diagnosis present

## 2016-07-25 DIAGNOSIS — M2578 Osteophyte, vertebrae: Secondary | ICD-10-CM | POA: Diagnosis not present

## 2016-07-25 DIAGNOSIS — R2689 Other abnormalities of gait and mobility: Secondary | ICD-10-CM | POA: Insufficient documentation

## 2016-07-25 LAB — CREATININE, SERUM: CREATININE: 0.74 mg/dL (ref 0.44–1.00)

## 2016-07-25 LAB — BUN: BUN: 14 mg/dL (ref 6–20)

## 2016-07-25 MED ORDER — GADOBENATE DIMEGLUMINE 529 MG/ML IV SOLN
17.0000 mL | Freq: Once | INTRAVENOUS | Status: AC | PRN
Start: 2016-07-25 — End: 2016-07-25
  Administered 2016-07-25: 17 mL via INTRAVENOUS

## 2016-07-28 ENCOUNTER — Other Ambulatory Visit: Payer: Self-pay | Admitting: Neurology

## 2016-07-28 DIAGNOSIS — R9082 White matter disease, unspecified: Secondary | ICD-10-CM

## 2016-07-28 DIAGNOSIS — R296 Repeated falls: Secondary | ICD-10-CM

## 2016-08-07 ENCOUNTER — Ambulatory Visit
Admission: RE | Admit: 2016-08-07 | Discharge: 2016-08-07 | Disposition: A | Discharge status: Home / Self Care | Payer: BLUE CROSS/BLUE SHIELD | Source: Ambulatory Visit | Attending: Neurology | Admitting: Neurology

## 2016-08-07 DIAGNOSIS — R296 Repeated falls: Secondary | ICD-10-CM | POA: Diagnosis present

## 2016-08-07 DIAGNOSIS — R9082 White matter disease, unspecified: Secondary | ICD-10-CM | POA: Insufficient documentation

## 2016-08-07 LAB — PROTIME-INR
INR: 0.92
Prothrombin Time: 12.4 seconds (ref 11.4–15.2)

## 2016-08-07 LAB — CBC
HCT: 36.2 % (ref 35.0–47.0)
Hemoglobin: 11.8 g/dL — ABNORMAL LOW (ref 12.0–16.0)
MCH: 29.4 pg (ref 26.0–34.0)
MCHC: 32.6 g/dL (ref 32.0–36.0)
MCV: 90.2 fL (ref 80.0–100.0)
PLATELETS: 241 10*3/uL (ref 150–440)
RBC: 4.01 MIL/uL (ref 3.80–5.20)
RDW: 14.1 % (ref 11.5–14.5)
WBC: 4.4 10*3/uL (ref 3.6–11.0)

## 2016-08-07 LAB — PROTEIN, CSF: Total  Protein, CSF: 45 mg/dL (ref 15–45)

## 2016-08-07 LAB — GLUCOSE, RANDOM: Glucose, Bld: 102 mg/dL — ABNORMAL HIGH (ref 65–99)

## 2016-08-07 LAB — GLUCOSE, CSF: Glucose, CSF: 56 mg/dL (ref 40–70)

## 2016-08-07 NOTE — Discharge Instructions (Signed)
Lumbar Puncture, Care After °Refer to this sheet in the next few weeks. These instructions provide you with information on caring for yourself after your procedure. Your health care provider may also give you more specific instructions. Your treatment has been planned according to current medical practices, but problems sometimes occur. Call your health care provider if you have any problems or questions after your procedure. °What can I expect after the procedure? °After your procedure, it is typical to have the following sensations: °· Mild discomfort or pain at the insertion site. °· Mild headache that is relieved with pain medicines. ° °Follow these instructions at home: ° °· Avoid lifting anything heavier than 10 lb (4.5 kg) for at least 12 hours after the procedure. °· Drink enough fluids to keep your urine clear or pale yellow. °Contact a health care provider if: °· You have fever or chills. °· You have nausea or vomiting. °· You have a headache that lasts for more than 2 days. °Get help right away if: °· You have any numbness or tingling in your legs. °· You are unable to control your bowel or bladder. °· You have bleeding or swelling in your back at the insertion site. °· You are dizzy or faint. °This information is not intended to replace advice given to you by your health care provider. Make sure you discuss any questions you have with your health care provider. °Document Released: 06/10/2013 Document Revised: 11/11/2015 Document Reviewed: 02/11/2013 °Elsevier Interactive Patient Education © 2017 Elsevier Inc. ° °

## 2016-08-07 NOTE — OR Nursing (Signed)
Dr register assessed pt oked for discharge

## 2016-08-07 NOTE — Progress Notes (Signed)
Patient ID: Brittney Stephens, female   DOB: 12/17/1955, 61 y.o.   MRN: [id number] Pt. Stable without complaint after myelogram.VSS.Back stable D/C instructions given. F/U with her M.D.

## 2016-08-08 LAB — CSF CELL COUNT WITH DIFFERENTIAL
Eosinophils, CSF: 0 %
LYMPHS CSF: 20 %
Monocyte-Macrophage-Spinal Fluid: 1 %
RBC COUNT CSF: 10679 /mm3 — AB (ref 0–3)
SEGMENTED NEUTROPHILS-CSF: 79 %
Tube #: 3
WBC, CSF: 17 /mm3 (ref 0–5)

## 2016-08-08 LAB — IGG 4: IGG 4: 7 mg/dL (ref 2–96)

## 2016-08-10 LAB — OLIGOCLONAL BANDS, CSF + SERM

## 2016-08-10 LAB — ANGIOTENSIN CONVERTING ENZYME, CSF: ANGIO CONVERT ENZYME: 1.7 U/L (ref 0.0–2.5)

## 2016-08-12 LAB — HSV(HERPES SMPLX VRS)ABS-I+II(IGG)-CSF: HSV Type I/II Ab, IgG CSF: 3.01 IV — ABNORMAL HIGH (ref <=0.89)

## 2017-04-18 ENCOUNTER — Other Ambulatory Visit: Payer: Self-pay | Admitting: Surgery

## 2017-04-18 DIAGNOSIS — M7582 Other shoulder lesions, left shoulder: Secondary | ICD-10-CM

## 2017-04-18 DIAGNOSIS — M25512 Pain in left shoulder: Secondary | ICD-10-CM

## 2017-04-18 DIAGNOSIS — M75102 Unspecified rotator cuff tear or rupture of left shoulder, not specified as traumatic: Secondary | ICD-10-CM

## 2017-04-25 ENCOUNTER — Ambulatory Visit
Admission: RE | Admit: 2017-04-25 | Discharge: 2017-04-25 | Disposition: A | Discharge status: Home / Self Care | Payer: BLUE CROSS/BLUE SHIELD | Source: Ambulatory Visit | Attending: Surgery | Admitting: Surgery

## 2017-04-25 DIAGNOSIS — M25512 Pain in left shoulder: Secondary | ICD-10-CM | POA: Insufficient documentation

## 2017-04-25 DIAGNOSIS — M75102 Unspecified rotator cuff tear or rupture of left shoulder, not specified as traumatic: Secondary | ICD-10-CM | POA: Diagnosis present

## 2017-04-25 DIAGNOSIS — M7582 Other shoulder lesions, left shoulder: Secondary | ICD-10-CM | POA: Diagnosis present

## 2017-07-18 ENCOUNTER — Other Ambulatory Visit: Payer: Self-pay

## 2017-07-18 NOTE — Anesthesia Preprocedure Evaluation (Addendum)
Anesthesia Evaluation  Patient identified by MRN, date of birth, ID band Patient awake    Reviewed: Allergy & Precautions, H&P , NPO status , Patient's Chart, lab work & pertinent test results, reviewed documented beta blocker date and time   Airway Mallampati: II  TM Distance: >3 FB Neck ROM: full    Dental  (+) Caps   Pulmonary neg pulmonary ROS, former smoker,    Pulmonary exam normal breath sounds clear to auscultation       Cardiovascular Exercise Tolerance: Good hypertension, + CAD   Rhythm:regular Rate:Normal     Neuro/Psych negative neurological ROS  negative psych ROS   GI/Hepatic Neg liver ROS, GERD  ,  Endo/Other  negative endocrine ROS  Renal/GU negative Renal ROS  negative genitourinary   Musculoskeletal   Abdominal   Peds  Hematology  (+) anemia ,   Anesthesia Other Findings   Reproductive/Obstetrics negative OB ROS                            Anesthesia Physical Anesthesia Plan  ASA: III  Anesthesia Plan: General ETT   Post-op Pain Management:  Regional for Post-op pain   Induction:   PONV Risk Score and Plan:   Airway Management Planned:   Additional Equipment:   Intra-op Plan:   Post-operative Plan:   Informed Consent: I have reviewed the patients History and Physical, chart, labs and discussed the procedure including the risks, benefits and alternatives for the proposed anesthesia with the patient or authorized representative who has indicated his/her understanding and acceptance.   Dental Advisory Given  Plan Discussed with: CRNA  Anesthesia Plan Comments:         Anesthesia Quick Evaluation  From intubation note 2017:  Difficulty was anticipated; Grade 3; MAC, 3; 7 mm; Cuffed; 2 (DL X 1 with MAC 3, grade III view, unable to successfully pass ETT through VC. Second DL with MAC 3, grade III view, successful intubation over bougie.); Bougie  Stylet; ETCO2 (Capnography), Bilateral Breath Sounds, Direct Visualization; 21 cm

## 2017-07-25 ENCOUNTER — Ambulatory Visit: Payer: BLUE CROSS/BLUE SHIELD | Admitting: Anesthesiology

## 2017-07-25 ENCOUNTER — Ambulatory Visit
Admission: RE | Admit: 2017-07-25 | Discharge: 2017-07-25 | Disposition: A | Discharge status: Home / Self Care | Payer: BLUE CROSS/BLUE SHIELD | Source: Ambulatory Visit | Attending: Surgery | Admitting: Surgery

## 2017-07-25 ENCOUNTER — Encounter
Admission: RE | Disposition: A | Discharge status: Home / Self Care | Payer: Self-pay | Source: Ambulatory Visit | Attending: Surgery

## 2017-07-25 DIAGNOSIS — Z881 Allergy status to other antibiotic agents status: Secondary | ICD-10-CM | POA: Diagnosis not present

## 2017-07-25 DIAGNOSIS — Z86010 Personal history of colonic polyps: Secondary | ICD-10-CM | POA: Insufficient documentation

## 2017-07-25 DIAGNOSIS — D689 Coagulation defect, unspecified: Secondary | ICD-10-CM | POA: Diagnosis not present

## 2017-07-25 DIAGNOSIS — M7522 Bicipital tendinitis, left shoulder: Secondary | ICD-10-CM | POA: Insufficient documentation

## 2017-07-25 DIAGNOSIS — S43402A Unspecified sprain of left shoulder joint, initial encounter: Secondary | ICD-10-CM | POA: Diagnosis not present

## 2017-07-25 DIAGNOSIS — Z791 Long term (current) use of non-steroidal anti-inflammatories (NSAID): Secondary | ICD-10-CM | POA: Diagnosis not present

## 2017-07-25 DIAGNOSIS — I10 Essential (primary) hypertension: Secondary | ICD-10-CM | POA: Insufficient documentation

## 2017-07-25 DIAGNOSIS — Z9889 Other specified postprocedural states: Secondary | ICD-10-CM | POA: Diagnosis not present

## 2017-07-25 DIAGNOSIS — M24112 Other articular cartilage disorders, left shoulder: Secondary | ICD-10-CM | POA: Insufficient documentation

## 2017-07-25 DIAGNOSIS — Z87891 Personal history of nicotine dependence: Secondary | ICD-10-CM | POA: Diagnosis not present

## 2017-07-25 DIAGNOSIS — M67912 Unspecified disorder of synovium and tendon, left shoulder: Secondary | ICD-10-CM | POA: Insufficient documentation

## 2017-07-25 DIAGNOSIS — M19012 Primary osteoarthritis, left shoulder: Secondary | ICD-10-CM | POA: Insufficient documentation

## 2017-07-25 DIAGNOSIS — Z8249 Family history of ischemic heart disease and other diseases of the circulatory system: Secondary | ICD-10-CM | POA: Diagnosis not present

## 2017-07-25 DIAGNOSIS — M810 Age-related osteoporosis without current pathological fracture: Secondary | ICD-10-CM | POA: Insufficient documentation

## 2017-07-25 DIAGNOSIS — M94212 Chondromalacia, left shoulder: Secondary | ICD-10-CM | POA: Insufficient documentation

## 2017-07-25 DIAGNOSIS — Z79899 Other long term (current) drug therapy: Secondary | ICD-10-CM | POA: Insufficient documentation

## 2017-07-25 DIAGNOSIS — Z9851 Tubal ligation status: Secondary | ICD-10-CM | POA: Insufficient documentation

## 2017-07-25 DIAGNOSIS — Z888 Allergy status to other drugs, medicaments and biological substances status: Secondary | ICD-10-CM | POA: Diagnosis not present

## 2017-07-25 DIAGNOSIS — Z800 Family history of malignant neoplasm of digestive organs: Secondary | ICD-10-CM | POA: Insufficient documentation

## 2017-07-25 DIAGNOSIS — Z8711 Personal history of peptic ulcer disease: Secondary | ICD-10-CM | POA: Insufficient documentation

## 2017-07-25 DIAGNOSIS — K219 Gastro-esophageal reflux disease without esophagitis: Secondary | ICD-10-CM | POA: Diagnosis not present

## 2017-07-25 DIAGNOSIS — X58XXXA Exposure to other specified factors, initial encounter: Secondary | ICD-10-CM | POA: Diagnosis not present

## 2017-07-25 DIAGNOSIS — Z9049 Acquired absence of other specified parts of digestive tract: Secondary | ICD-10-CM | POA: Diagnosis not present

## 2017-07-25 DIAGNOSIS — I2510 Atherosclerotic heart disease of native coronary artery without angina pectoris: Secondary | ICD-10-CM | POA: Diagnosis not present

## 2017-07-25 DIAGNOSIS — Z91013 Allergy to seafood: Secondary | ICD-10-CM | POA: Insufficient documentation

## 2017-07-25 DIAGNOSIS — Z87892 Personal history of anaphylaxis: Secondary | ICD-10-CM | POA: Insufficient documentation

## 2017-07-25 DIAGNOSIS — Z803 Family history of malignant neoplasm of breast: Secondary | ICD-10-CM | POA: Diagnosis not present

## 2017-07-25 DIAGNOSIS — Z833 Family history of diabetes mellitus: Secondary | ICD-10-CM | POA: Insufficient documentation

## 2017-07-25 HISTORY — PX: SHOULDER ARTHROSCOPY: SHX128

## 2017-07-25 HISTORY — DX: Dizziness and giddiness: R42

## 2017-07-25 HISTORY — DX: Unspecified osteoarthritis, unspecified site: M19.90

## 2017-07-25 HISTORY — DX: Unspecified disorder of synovium and tendon, left shoulder: M67.912

## 2017-07-25 HISTORY — DX: Other complications of anesthesia, initial encounter: T88.59XA

## 2017-07-25 HISTORY — DX: Hemorrhagic condition, unspecified: D69.9

## 2017-07-25 HISTORY — DX: Adverse effect of unspecified anesthetic, initial encounter: T41.45XA

## 2017-07-25 HISTORY — DX: Gastro-esophageal reflux disease without esophagitis: K21.9

## 2017-07-25 HISTORY — DX: Presence of dental prosthetic device (complete) (partial): Z97.2

## 2017-07-25 HISTORY — DX: Atherosclerotic heart disease of native coronary artery without angina pectoris: I25.10

## 2017-07-25 SURGERY — ARTHROSCOPY, SHOULDER
Anesthesia: General | Laterality: Left | Wound class: Clean

## 2017-07-25 MED ORDER — LACTATED RINGERS IV SOLN
INTRAVENOUS | Status: DC
  Administered 2017-07-25: 12:00:00 via INTRAVENOUS

## 2017-07-25 MED ORDER — DEXAMETHASONE SODIUM PHOSPHATE 4 MG/ML IJ SOLN
INTRAMUSCULAR | Status: DC | PRN
  Administered 2017-07-25: 4 mg via INTRAVENOUS

## 2017-07-25 MED ORDER — LIDOCAINE HCL (CARDIAC) 20 MG/ML IV SOLN
INTRAVENOUS | Status: DC | PRN
  Administered 2017-07-25: 40 mg via INTRATRACHEAL

## 2017-07-25 MED ORDER — PROMETHAZINE HCL 25 MG/ML IJ SOLN: 6.2500 mg | INTRAMUSCULAR | Status: DC | PRN

## 2017-07-25 MED ORDER — OXYCODONE HCL 5 MG/5ML PO SOLN: 5.0000 mg | Freq: Once | ORAL | Status: AC | PRN

## 2017-07-25 MED ORDER — LIDOCAINE-EPINEPHRINE 1 %-1:100000 IJ SOLN
INTRAMUSCULAR | Status: DC | PRN
  Administered 2017-07-25: 30 mL

## 2017-07-25 MED ORDER — MIDAZOLAM HCL 2 MG/2ML IJ SOLN
INTRAMUSCULAR | Status: DC | PRN
  Administered 2017-07-25: 2 mg via INTRAVENOUS

## 2017-07-25 MED ORDER — FENTANYL CITRATE (PF) 100 MCG/2ML IJ SOLN
INTRAMUSCULAR | Status: DC | PRN
  Administered 2017-07-25: 100 ug via INTRAVENOUS
  Administered 2017-07-25: 25 ug via INTRAVENOUS

## 2017-07-25 MED ORDER — OXYCODONE HCL 5 MG PO TABS
5.0000 mg | ORAL_TABLET | Freq: Once | ORAL | Status: AC | PRN
  Administered 2017-07-25: 5 mg via ORAL

## 2017-07-25 MED ORDER — MEPERIDINE HCL 25 MG/ML IJ SOLN: 6.2500 mg | INTRAMUSCULAR | Status: DC | PRN

## 2017-07-25 MED ORDER — CEFAZOLIN SODIUM 1 G IJ SOLR
2000.0000 mg | Freq: Once | INTRAMUSCULAR | Status: AC
  Administered 2017-07-25: 2000 mg via INTRAVENOUS

## 2017-07-25 MED ORDER — HYDROCODONE-ACETAMINOPHEN 7.5-325 MG PO TABS
1.0000 | ORAL_TABLET | Freq: Four times a day (QID) | ORAL | 0 refills | Status: AC | PRN
Start: 2017-07-25 — End: ?

## 2017-07-25 MED ORDER — EPHEDRINE SULFATE 50 MG/ML IJ SOLN
INTRAMUSCULAR | Status: DC | PRN
  Administered 2017-07-25: 10 mg via INTRAVENOUS

## 2017-07-25 MED ORDER — PROPOFOL 10 MG/ML IV BOLUS
INTRAVENOUS | Status: DC | PRN
  Administered 2017-07-25: 200 mg via INTRAVENOUS

## 2017-07-25 MED ORDER — DEXMEDETOMIDINE HCL 200 MCG/2ML IV SOLN
INTRAVENOUS | Status: DC | PRN
  Administered 2017-07-25: 8 ug via INTRAVENOUS

## 2017-07-25 MED ORDER — ONDANSETRON HCL 4 MG/2ML IJ SOLN
INTRAMUSCULAR | Status: DC | PRN
  Administered 2017-07-25: 4 mg via INTRAVENOUS

## 2017-07-25 MED ORDER — FENTANYL CITRATE (PF) 100 MCG/2ML IJ SOLN
25.0000 ug | INTRAMUSCULAR | Status: DC | PRN
  Administered 2017-07-25 (×2): 25 ug via INTRAVENOUS

## 2017-07-25 SURGICAL SUPPLY — 41 items
ANCH SUT 2 2.9 2 LD TPR NDL (Anchor) ×1 IMPLANT
ANCHOR JUGGERKNOT WTAP NDL 2.9 (Anchor) ×2 IMPLANT
BIT DRILL JUGRKNT W/NDL BIT2.9 (DRILL) IMPLANT
BLADE FULL RADIUS 3.5 (BLADE) ×3 IMPLANT
BUR ACROMIONIZER 4.0 (BURR) ×3 IMPLANT
CANNULA SHAVER 8MMX76MM (CANNULA) ×3 IMPLANT
CHLORAPREP W/TINT 26ML (MISCELLANEOUS) ×4 IMPLANT
COVER LIGHT HANDLE UNIVERSAL (MISCELLANEOUS) ×6 IMPLANT
COVER MAYO STAND STRL (DRAPES) ×3 IMPLANT
DRAPE IMP U-DRAPE 54X76 (DRAPES) ×6 IMPLANT
DRILL JUGGERKNOT W/NDL BIT 2.9 (DRILL) ×3
ELECT REM PT RETURN 9FT ADLT (ELECTROSURGICAL) ×3
ELECTRODE REM PT RTRN 9FT ADLT (ELECTROSURGICAL) IMPLANT
GAUZE PETRO XEROFOAM 1X8 (MISCELLANEOUS) ×3 IMPLANT
GAUZE SPONGE 4X4 12PLY STRL (GAUZE/BANDAGES/DRESSINGS) ×3 IMPLANT
GLOVE BIO SURGEON STRL SZ8 (GLOVE) ×6 IMPLANT
GLOVE INDICATOR 8.0 STRL GRN (GLOVE) ×3 IMPLANT
GOWN STRL REUS W/ TWL LRG LVL3 (GOWN DISPOSABLE) ×1 IMPLANT
GOWN STRL REUS W/ TWL XL LVL3 (GOWN DISPOSABLE) ×1 IMPLANT
GOWN STRL REUS W/TWL LRG LVL3 (GOWN DISPOSABLE) ×3
GOWN STRL REUS W/TWL XL LVL3 (GOWN DISPOSABLE) ×3
IV LACTATED RINGER IRRG 3000ML (IV SOLUTION) ×6
IV LR IRRIG 3000ML ARTHROMATIC (IV SOLUTION) ×2 IMPLANT
MANIFOLD 4PT FOR NEPTUNE1 (MISCELLANEOUS) ×3 IMPLANT
MAT BLUE FLOOR 46X72 FLO (MISCELLANEOUS) ×3 IMPLANT
NDL HYPO 21X1.5 SAFETY (NEEDLE) ×1 IMPLANT
NDL REVERSE CUT 1/2 CRC (NEEDLE) ×1 IMPLANT
NEEDLE HYPO 21X1.5 SAFETY (NEEDLE) ×3 IMPLANT
NEEDLE REVERSE CUT 1/2 CRC (NEEDLE) IMPLANT
PACK ARTHROSCOPY SHOULDER (MISCELLANEOUS) ×3 IMPLANT
SLING ULTRA II LG (MISCELLANEOUS) ×2 IMPLANT
STAPLER SKIN PROX 35W (STAPLE) ×3 IMPLANT
STRAP BODY AND KNEE 60X3 (MISCELLANEOUS) ×6 IMPLANT
SUT ETHIBOND 0 MO6 C/R (SUTURE) ×2 IMPLANT
SUT VIC AB 2-0 CT1 27 (SUTURE) ×6
SUT VIC AB 2-0 CT1 TAPERPNT 27 (SUTURE) IMPLANT
TAPE MICROFOAM 4IN (TAPE) ×3 IMPLANT
TUBING ARTHRO INFLOW-ONLY STRL (TUBING) ×3 IMPLANT
TUBING CONNECTING 10 (TUBING) ×2 IMPLANT
TUBING CONNECTING 10' (TUBING) ×1
WAND HAND CNTRL MULTIVAC 90 (MISCELLANEOUS) ×3 IMPLANT

## 2017-07-25 NOTE — Anesthesia Procedure Notes (Signed)
Anesthesia Regional Block: Interscalene brachial plexus block   Pre-Anesthetic Checklist: ,, timeout performed, Correct Patient, Correct Site, Correct Laterality, Correct Procedure, Correct Position, site marked, Risks and benefits discussed,  Surgical consent,  Pre-op evaluation,  At surgeon's request and post-op pain management  Laterality: Left  Prep: chloraprep       Needles:  Injection technique: Single-shot  Needle Type: Stimiplex     Needle Length: 5cm  Needle Gauge: 22     Additional Needles:   Procedures:,,,, ultrasound used (permanent image in chart),,,,  Narrative:  Start time: 07/25/2017 10:40 AM End time: 07/25/2017 10:45 AM Injection made incrementally with aspirations every 5 mL.  Performed by: Personally  Anesthesiologist: Marice Potter, MD

## 2017-07-25 NOTE — Discharge Instructions (Signed)
General Anesthesia, Adult, Care After These instructions provide you with information about caring for yourself after your procedure. Your health care provider may also give you more specific instructions. Your treatment has been planned according to current medical practices, but problems sometimes occur. Call your health care provider if you have any problems or questions after your procedure. What can I expect after the procedure? After the procedure, it is common to have:  Vomiting.  A sore throat.  Mental slowness.  It is common to feel:  Nauseous.  Cold or shivery.  Sleepy.  Tired.  Sore or achy, even in parts of your body where you did not have surgery.  Follow these instructions at home: For at least 24 hours after the procedure:  Do not: ? Participate in activities where you could fall or become injured. ? Drive. ? Use heavy machinery. ? Drink alcohol. ? Take sleeping pills or medicines that cause drowsiness. ? Make important decisions or sign legal documents. ? Take care of children on your own.  Rest. Eating and drinking  If you vomit, drink water, juice, or soup when you can drink without vomiting.  Drink enough fluid to keep your urine clear or pale yellow.  Make sure you have little or no nausea before eating solid foods.  Follow the diet recommended by your health care provider. General instructions  Have a responsible adult stay with you until you are awake and alert.  Return to your normal activities as told by your health care provider. Ask your health care provider what activities are safe for you.  Take over-the-counter and prescription medicines only as told by your health care provider.  If you smoke, do not smoke without supervision.  Keep all follow-up visits as told by your health care provider. This is important. Contact a health care provider if:  You continue to have nausea or vomiting at home, and medicines are not helpful.  You  cannot drink fluids or start eating again.  You cannot urinate after 8-12 hours.  You develop a skin rash.  You have fever.  You have increasing redness at the site of your procedure. Get help right away if:  You have difficulty breathing.  You have chest pain.  You have unexpected bleeding.  You feel that you are having a life-threatening or urgent problem. This information is not intended to replace advice given to you by your health care provider. Make sure you discuss any questions you have with your health care provider. Document Released: 09/11/2000 Document Revised: 11/08/2015 Document Reviewed: 05/20/2015 Elsevier Interactive Patient Education  2018 [employer].   Keep dressing dry and intact.  May shower after dressing changed on post-op day #4 (Monday).  Cover staples/sutures with Band-Aids after drying off. Apply ice frequently to shoulder. Take ibuprofen 600 mg TID with meals for 7-10 days, then as necessary. Take oxycodone as prescribed when needed.  May supplement with ES Tylenol if necessary. Keep shoulder immobilizer on at all times except may remove for bathing purposes. Follow-up in 10-14 days or as scheduled.

## 2017-07-25 NOTE — H&P (Signed)
Paper H&P to be scanned into permanent record. H&P reviewed and patient re-examined. No changes. 

## 2017-07-25 NOTE — Anesthesia Postprocedure Evaluation (Signed)
Anesthesia Post Note  Patient: [name]  Procedure(s) Performed: ARTHROSCOPY SHOULDER WITH DEBRIDEMENT, DECOMPRESSION AND BICEPS TENODESIS (Left )  Patient location during evaluation: PACU Anesthesia Type: General Level of consciousness: awake and alert Pain management: pain level controlled Vital Signs Assessment: post-procedure vital signs reviewed and stable Respiratory status: spontaneous breathing, nonlabored ventilation, respiratory function stable and patient connected to nasal cannula oxygen Cardiovascular status: blood pressure returned to baseline and stable Postop Assessment: no apparent nausea or vomiting Anesthetic complications: no    SCOURAS, NICOLE ELAINE

## 2017-07-25 NOTE — Transfer of Care (Signed)
Immediate Anesthesia Transfer of Care Note  Patient: Brittney Stephens  Procedure(s) Performed: ARTHROSCOPY SHOULDER WITH DEBRIDEMENT, DECOMPRESSION AND BICEPS TENODESIS (Left )  Patient Location: PACU  Anesthesia Type: General ETT  Level of Consciousness: awake, alert  and patient cooperative  Airway and Oxygen Therapy: Patient Spontanous Breathing and Patient connected to supplemental oxygen  Post-op Assessment: Post-op Vital signs reviewed, Patient's Cardiovascular Status Stable, Respiratory Function Stable, Patent Airway and No signs of Nausea or vomiting  Post-op Vital Signs: Reviewed and stable  Complications: No apparent anesthesia complications

## 2017-07-25 NOTE — Op Note (Signed)
07/25/2017  1:44 PM  Patient:   Brittney Stephens  Pre-Op Diagnosis:   Impingement/tendinopathy, left shoulder.  Post-Op Diagnosis: Impingement/tendinopathy with degenerative labral tear, degenerative joint disease, and biceps tendinopathy, left shoulder.  Procedure: Extensive arthroscopic debridement, arthroscopic abrasion chondroplasty of grade IIII-IV chondromalacia of central humerus, arthroscopic subacromial decompression, and mini-open biceps tenodesis, left shoulder.  Anesthesia: General LMA with interscalene block placed preoperatively by the anesthesiologist.  Surgeon:   Pascal Lux, MD  Assistant:   None  Findings: As above.  There were grade II chondromalacia changes involving the central portion of the glenoid.  There was extensive labral fraying superiorly extending from approximately the 10:30 to the 2 o'clock position without frank detachment from the glenoid.  The rotator cuff was in satisfactory condition.  The biceps tendon demonstrated some tendinopathy changes.  Its labral attachment, as well as some "lip sticking" within the bicipital groove.  Complications: None  Fluids:   700 cc  Estimated blood loss: 5 cc  Tourniquet time: None  Drains: None  Closure: Staples   Brief clinical note: The patient is a 62 year old female with a history of progressively worsening left shoulder pain. The patient's symptoms have progressed despite medications, activity modification, etc. The patient's history and examination are consistent with impingement/tendinopathy with a possible rotator cuff tear. An MRI scan was inconclusive for rotator cuff pathology. The patient presents at this time for definitive management of these shoulder symptoms.  Procedure: The patient underwent placement of an interscalene block by the anesthesiologist in the preoperative holding area before being brought into the operating room and lain in the supine position. The  patient then underwent general endotracheal intubation and anesthesia before being repositioned in the beach chair position using the beach chair positioner. The left shoulder and upper extremity were prepped with ChloraPrep solution before being draped sterilely. Preoperative antibiotics were administered. A timeout was performed to confirm the proper surgical site before the expected portal sites and incision site were injected with 0.5% Sensorcaine with epinephrine. A posterior portal was created and the glenohumeral joint thoroughly inspected with the findings as described above. An anterior portal was created using an outside-in technique. The labrum and rotator cuff were further probed, again confirming the above-noted findings. The areas of labral fraying was debrided back to stable margins using a full-radius resector. In addition, areas of loose articular cartilage on the humerus also were debrided back to stable margins using a full-radius resector. The ArthroCare wand was inserted and used to release the biceps tendon from its labral anchor, as well as to gently anneal the margins of articular cartilage damage on the humeral head using the coag setting. The ArthroCare wand also was used to obtain hemostasis as well as to "anneal" the labrum superiorly and anteriorly. The instruments were removed from the joint after suctioning the excess fluid.  The camera was repositioned through the posterior portal into the subacromial space. A separate lateral portal was created using an outside-in technique. The 3.5 mm full-radius resector was introduced and used to perform a subtotal bursectomy. The ArthroCare wand was then inserted and used to remove the periosteal tissue off the undersurface of the anterior third of the acromion as well as to recess the coracoacromial ligament from its attachment along the anterior and lateral margins of the acromion. The 4.0 mm acromionizing bur was introduced and used to  complete the decompression by removing the undersurface of the anterior third of the acromion. The full radius resector was reintroduced to remove  any residual bony debris before the ArthroCare wand was reintroduced to obtain hemostasis. The instruments were then removed from the subacromial space after suctioning the excess fluid.  An approximately 4-5 cm incision was made over the anterolateral aspect of the shoulder beginning at the anterolateral corner of the acromion and extending distally in line with the bicipital groove. This incision was carried down through the subcutaneous tissues to expose the deltoid fascia. The raphae between the anterior and middle thirds was identified and this plane developed to provide access into the subacromial space. Additional bursal tissues were debrided sharply using Metzenbaum scissors. The bursal surface of the rotator cuff was carefully inspected and found to be intact. The bicipital groove was identified by palpation and opened for 1-1.5 cm. The biceps tendon stump was retrieved through this defect. The floor of the bicipital groove was roughened with a curet before a Biomet 2.9 mm JuggerKnot anchor was inserted. Both sets of sutures were passed through the biceps tendon and tied securely to effect the tenodesis. The bicipital sheath was reapproximated using two #0 Ethibond interrupted sutures, incorporating the biceps tendon to further reinforce the tenodesis.  The wound was copiously irrigated with sterile saline solution before the deltoid raphae was reapproximated using 2-0 Vicryl interrupted sutures. The subcutaneous tissues were closed in two layers using 2-0 Vicryl interrupted sutures before the skin was closed using staples. The portal sites also were closed using staples. A sterile bulky dressing was applied to the shoulder before the arm was placed into a shoulder immobilizer. The patient was then awakened, extubated, and returned to the recovery room in  satisfactory condition after tolerating the procedure well.

## 2017-07-25 NOTE — Anesthesia Procedure Notes (Signed)
Procedure Name: LMA Insertion Date/Time: 07/25/2017 12:21 PM Performed by: Janna Arch, CRNA Pre-anesthesia Checklist: Patient identified, Emergency Drugs available, Suction available and Patient being monitored Patient Re-evaluated:Patient Re-evaluated prior to induction Oxygen Delivery Method: Circle system utilized Preoxygenation: Pre-oxygenation with 100% oxygen Induction Type: IV induction Ventilation: Mask ventilation without difficulty LMA: LMA inserted LMA Size: 4.0 Number of attempts: 1 Tube secured with: Tape Dental Injury: Teeth and Oropharynx as per pre-operative assessment

## 2017-09-25 DIAGNOSIS — Z0000 Encounter for general adult medical examination without abnormal findings: Secondary | ICD-10-CM | POA: Insufficient documentation

## 2017-09-26 DIAGNOSIS — M47812 Spondylosis without myelopathy or radiculopathy, cervical region: Secondary | ICD-10-CM | POA: Insufficient documentation

## 2018-10-18 ENCOUNTER — Other Ambulatory Visit: Payer: Self-pay

## 2018-10-21 ENCOUNTER — Inpatient Hospital Stay: Payer: BLUE CROSS/BLUE SHIELD | Attending: Internal Medicine | Admitting: Internal Medicine

## 2018-10-21 ENCOUNTER — Other Ambulatory Visit: Payer: Self-pay

## 2018-10-21 DIAGNOSIS — R2689 Other abnormalities of gait and mobility: Secondary | ICD-10-CM | POA: Insufficient documentation

## 2018-10-21 DIAGNOSIS — R9082 White matter disease, unspecified: Secondary | ICD-10-CM | POA: Insufficient documentation

## 2018-10-21 DIAGNOSIS — R790 Abnormal level of blood mineral: Secondary | ICD-10-CM

## 2018-10-21 DIAGNOSIS — M818 Other osteoporosis without current pathological fracture: Secondary | ICD-10-CM | POA: Insufficient documentation

## 2018-10-21 DIAGNOSIS — M810 Age-related osteoporosis without current pathological fracture: Secondary | ICD-10-CM | POA: Diagnosis not present

## 2018-10-21 DIAGNOSIS — E538 Deficiency of other specified B group vitamins: Secondary | ICD-10-CM | POA: Insufficient documentation

## 2018-10-21 DIAGNOSIS — E611 Iron deficiency: Secondary | ICD-10-CM | POA: Insufficient documentation

## 2018-10-21 NOTE — Progress Notes (Signed)
I connected with Brittney Stephens on 10/21/18 at  9:00 AM EDT by video enabled telemedicine visit and verified that I am speaking with the correct person using two identifiers.  I discussed the limitations, risks, security and privacy concerns of performing an evaluation and management service by telemedicine and the availability of in-person appointments. I also discussed with the patient that there may be a patient responsible charge related to this service. The patient expressed understanding and agreed to proceed.    Other persons participating in the visit and their role in the encounter: none  Patient's location: home  Provider's location: home    No history exists.   # Low Iron- ? Malabsorption/B12 def [on Home B12 injections] # White matter disease/ gait instability [Dr.Sh[facility], [facility]] # Hx of barett's/GERD  Chief Complaint: osteoporosis/recommended Prolia  History of present illness:Brittney Stephens 63 y.o.  female with history of iron/B12 malabsorption and also history of osteoporosis is referred to [place] for further management.  Patient states that she is currently in the process of moving from [place] to [facility].  Patient previously seen in the cancer center 5 to 6 years ago for iron/B12 malabsorption.  Patient is currently on B12 injections.  About 12 years ago patient was on oral bisphosphonates-which led to improvement of her bone density.  However more recently patient's bone density in [place] noted at -2.5 T score [as per patient].  Given white matter disease-patient has history of falls gait instability.  Patient also had history of compression fractures and traumatic fractures of the leg and wrist.  Observation/objective: Patient will drop off records at the office-this week.   Assessment and plan: Osteoporosis, post-menopausal #Symptomatic osteoporosis-compression fractures/traumatic fractures given risk of fall.  BMD -2.5  as per patient [done in [place].  Given risk of Barrett's esophagus/GERD recommend Prolia.  # Discussed the benefits of Prolia in improving /treating osteoporosis. Discussed the potential side effects including but not limited to hypocalcemia osteonecrosis of the jaw.  Discussed regarding avoiding dental extraction around the time of Prolia.  #Question malabsorption-iron/B12 deficiency on B12 injections.   # DISPOSITION: #Schedule Prolia injections next week. #Follow-up 6 months-MD; CBC CMP-Prolia- Dr.B  Follow-up instructions:  I discussed the assessment and treatment plan with the patient.  The patient was provided an opportunity to ask questions and all were answered.  The patient agreed with the plan and demonstrated understanding of instructions.  The patient was advised to call back or seek an in person evaluation if the symptoms worsen or if the condition fails to improve as anticipated.  Dr. Charlaine Dalton Blackshear at [facility] 10/21/2018 9:38 AM

## 2018-10-21 NOTE — Assessment & Plan Note (Addendum)
#  Symptomatic osteoporosis-compression fractures/traumatic fractures given risk of fall.  BMD -2.5 as per patient [done in [place].  Given risk of Barrett's esophagus/GERD recommend Prolia.  # Discussed the benefits of Prolia in improving /treating osteoporosis. Discussed the potential side effects including but not limited to hypocalcemia osteonecrosis of the jaw.  Discussed regarding avoiding dental extraction around the time of Prolia.  #Question malabsorption-iron/B12 deficiency on B12 injections.   # DISPOSITION: #Schedule Prolia injections next week. #Follow-up 6 months-MD; CBC CMP-Prolia- Dr.B  Data reviewed from Dr. Rona Ravens previous doctor's office/ [place]-August 21, 2018-bone density lumbar spine T score -2.6/consistent with osteoporosis/bilateral mammogram negative; lung cancer screening CT scan benign-appearing small pulmonary nodules category 2 repeat imaging in 12 months creatinine 2.85 calcium 9.5; white count 6.3 hemoglobin 13.6 platelets

## 2018-10-31 ENCOUNTER — Inpatient Hospital Stay: Payer: BLUE CROSS/BLUE SHIELD

## 2018-10-31 ENCOUNTER — Other Ambulatory Visit: Payer: Self-pay

## 2018-10-31 DIAGNOSIS — M810 Age-related osteoporosis without current pathological fracture: Secondary | ICD-10-CM

## 2018-10-31 DIAGNOSIS — E538 Deficiency of other specified B group vitamins: Secondary | ICD-10-CM | POA: Diagnosis not present

## 2018-10-31 DIAGNOSIS — M818 Other osteoporosis without current pathological fracture: Secondary | ICD-10-CM | POA: Diagnosis present

## 2018-10-31 DIAGNOSIS — E611 Iron deficiency: Secondary | ICD-10-CM | POA: Diagnosis not present

## 2018-10-31 DIAGNOSIS — R9082 White matter disease, unspecified: Secondary | ICD-10-CM | POA: Diagnosis not present

## 2018-10-31 DIAGNOSIS — R2689 Other abnormalities of gait and mobility: Secondary | ICD-10-CM | POA: Diagnosis not present

## 2018-10-31 MED ORDER — DENOSUMAB 60 MG/ML ~~LOC~~ SOSY
60.0000 mg | PREFILLED_SYRINGE | Freq: Once | SUBCUTANEOUS | Status: AC
  Administered 2018-10-31: 10:00:00 60 mg via SUBCUTANEOUS
  Filled 2018-10-31: qty 1

## 2018-10-31 NOTE — Progress Notes (Signed)
Patient had labs done from PCP per Dr. Rogue Bussing patient is ok to receive Prolia injection today.

## 2019-01-30 ENCOUNTER — Encounter: Payer: Self-pay | Admitting: Family Medicine

## 2019-01-30 ENCOUNTER — Ambulatory Visit: Payer: BC Managed Care – PPO | Admitting: Family Medicine

## 2019-01-30 VITALS — BP 120/80 | HR 64 | Ht 66.0 in | Wt 191.0 lb

## 2019-01-30 DIAGNOSIS — G8929 Other chronic pain: Secondary | ICD-10-CM

## 2019-01-30 DIAGNOSIS — D510 Vitamin B12 deficiency anemia due to intrinsic factor deficiency: Secondary | ICD-10-CM

## 2019-01-30 DIAGNOSIS — M25512 Pain in left shoulder: Secondary | ICD-10-CM

## 2019-01-30 DIAGNOSIS — Z23 Encounter for immunization: Secondary | ICD-10-CM | POA: Diagnosis not present

## 2019-01-30 DIAGNOSIS — I2510 Atherosclerotic heart disease of native coronary artery without angina pectoris: Secondary | ICD-10-CM | POA: Diagnosis not present

## 2019-01-30 DIAGNOSIS — K2100 Gastro-esophageal reflux disease with esophagitis, without bleeding: Secondary | ICD-10-CM

## 2019-01-30 DIAGNOSIS — I10 Essential (primary) hypertension: Secondary | ICD-10-CM

## 2019-01-30 DIAGNOSIS — K2270 Barrett's esophagus without dysplasia: Secondary | ICD-10-CM

## 2019-01-30 DIAGNOSIS — Z7689 Persons encountering health services in other specified circumstances: Secondary | ICD-10-CM

## 2019-01-30 DIAGNOSIS — M810 Age-related osteoporosis without current pathological fracture: Secondary | ICD-10-CM

## 2019-01-30 MED ORDER — CHLORTHALIDONE 25 MG PO TABS: 25.0000 mg | ORAL_TABLET | Freq: Every day | ORAL | 1 refills | Status: DC

## 2019-01-30 MED ORDER — PANTOPRAZOLE SODIUM 40 MG PO TBEC: 40.0000 mg | DELAYED_RELEASE_TABLET | Freq: Two times a day (BID) | ORAL | 1 refills | Status: AC

## 2019-01-30 NOTE — Addendum Note (Signed)
Addended by: Fredderick Severance on: 01/30/2019 03:48 PM   Modules accepted: Orders

## 2019-01-30 NOTE — Progress Notes (Signed)
Date:  01/30/2019   Name:  Brittney Stephens   DOB:  [birth year]   MRN:  [id number]   Chief Complaint: Establish Care, tdap vaccine, and Hypertension  Patient is a 63 year old female who presents for an establishment of care evaluation. The patient reports the following problems: htn.cad,osteoporosis,barrett's esophagitis,/anastomosis ulceration/pernicious anemia. Health maintenance has been reviewed tetanus toxoid.  Hypertension This is a chronic problem. The current episode started more than 1 year ago. The problem has been waxing and waning since onset. The problem is controlled. Pertinent negatives include no anxiety, blurred vision, chest pain, headaches, malaise/fatigue, neck pain, orthopnea, palpitations, peripheral edema, PND, shortness of breath or sweats. There are no associated agents to hypertension. There are no known risk factors for coronary artery disease. Past treatments include diuretics. The current treatment provides moderate improvement. There are no compliance problems.  There is no history of angina, kidney disease, CAD/MI, CVA, heart failure, left ventricular hypertrophy, PVD or retinopathy. There is no history of chronic renal disease, a hypertension causing med or renovascular disease.    Review of Systems  Constitutional: Negative.  Negative for chills, fatigue, fever, malaise/fatigue and unexpected weight change.  HENT: Negative for congestion, ear discharge, ear pain, rhinorrhea, sinus pressure, sneezing and sore throat.   Eyes: Negative for blurred vision, photophobia, pain, discharge, redness and itching.  Respiratory: Negative for cough, shortness of breath, wheezing and stridor.   Cardiovascular: Negative for chest pain, palpitations, orthopnea and PND.  Gastrointestinal: Negative for abdominal pain, blood in stool, constipation, diarrhea, nausea and vomiting.  Endocrine: Negative for cold intolerance, heat intolerance, polydipsia, polyphagia and polyuria.   Genitourinary: Negative for dysuria, flank pain, frequency, hematuria, menstrual problem, pelvic pain, urgency, vaginal bleeding and vaginal discharge.  Musculoskeletal: Positive for arthralgias and myalgias. Negative for back pain and neck pain.       Poggi /  Skin: Negative for rash.  Allergic/Immunologic: Negative for environmental allergies and food allergies.  Neurological: Negative for dizziness, weakness, light-headedness, numbness and headaches.  Hematological: Negative for adenopathy. Does not bruise/bleed easily.  Psychiatric/Behavioral: Negative for dysphoric mood. The patient is not nervous/anxious.     Patient Active Problem List   Diagnosis Date Noted  . Osteoporosis, post-menopausal 10/21/2018  . Cervical spondylosis 09/26/2017  . Healthcare maintenance 09/25/2017  . 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 Primary osteoarthritis of left shoulder 07/25/2017  . Tendinitis of upper biceps tendon of left shoulder 07/25/2017  . Rotator cuff tendinitis, left 04/18/2017  . Distal radius fracture 01/25/2016  . Coronary artery disease involving native coronary artery 07/21/2015  . Irritable bowel syndrome with diarrhea 07/21/2015  . Senile purpura ([place]) 07/21/2015  . 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 GERD (gastroesophageal reflux disease) 02/14/2014  . Restless leg 02/14/2014  . PA (pernicious anemia) 02/14/2014  . Restless legs syndrome 02/14/2014  . Atherosclerosis of coronary artery 01/20/2014  . Episodic paroxysmal anxiety disorder 01/20/2014  . Panic anxiety syndrome 01/20/2014    Allergies  Allergen Reactions  . Azithromycin Anaphylaxis    Other reaction(s): UNKNOWN  . Erythromycin Other (See Comments) and Shortness Of Breath    Other Reaction: Other reaction  . Iodine Anaphylaxis    Other reaction(s): RASH  . Shellfish Allergy Anaphylaxis    Other reaction(s): UNKNOWN    Past Surgical History:  Procedure Laterality Date  . CARDIAC  CATHETERIZATION     10-15 YEARS AGO  . CHOLECYSTECTOMY    . FACIAL COSMETIC SURGERY    .  FOOT SURGERY    . GASTRIC BYPASS  2005  . OPEN REDUCTION INTERNAL FIXATION (ORIF) DISTAL RADIAL FRACTURE Left 01/26/2016   Procedure: OPEN REDUCTION INTERNAL FIXATION (ORIF) DISTAL RADIAL FRACTURE;  Surgeon: Hessie Knows, MD;  Location: [facility];  Service: Orthopedics;  Laterality: Left;  . RECTAL PROLAPSE REPAIR    . SHOULDER ARTHROSCOPY Left 07/25/2017   Procedure: ARTHROSCOPY SHOULDER WITH DEBRIDEMENT, DECOMPRESSION AND BICEPS TENODESIS;  Surgeon: Corky Mull, MD;  Location: [facility];  Service: Orthopedics;  Laterality: Left;  . STOMACH SURGERY  2010   [facility]  . TUBAL LIGATION      Social History   Tobacco Use  . Smoking status: Former Smoker    Packs/day: 2.00    Years: 38.00    Pack years: 76.00    Types: Cigarettes  . Smokeless tobacco: Never Used  . Tobacco comment: quit 10 years ago   Substance Use Topics  . Alcohol use: Yes    Alcohol/week: 9.0 standard drinks    Types: 7 Glasses of wine, 2 Standard drinks or equivalent per week  . Drug use: No     Medication list has been reviewed and updated.  Current Meds  Medication Sig  . CALCIUM PO Take 1 tablet by mouth daily.   . chlorthalidone (HYGROTON) 25 MG tablet Take 1 tablet by mouth daily.  . cyanocobalamin (,VITAMIN B-12,) 1000 MCG/ML injection Inject 1,000 mcg into the muscle every 30 (thirty) days.   Marland Kitchen loperamide (IMODIUM A-D) 2 MG tablet Take 4 mg by mouth once a week.  . Multiple Vitamin (MULTIVITAMIN) tablet Take 1 tablet by mouth daily.  . pantoprazole (PROTONIX) 40 MG tablet Take 40 mg by mouth 2 (two) times daily.    PHQ 2/9 Scores 01/30/2019  PHQ - 2 Score 0  PHQ- 9 Score 0    BP Readings from Last 3 Encounters:  01/30/19 120/80  07/25/17 (!) 145/84  08/07/16 (!) 182/88    Physical Exam Vitals signs and nursing note reviewed.  Constitutional:      Appearance: She is well-developed.   HENT:     Head: Normocephalic.     Right Ear: Tympanic membrane, ear canal and external ear normal. There is no impacted cerumen.     Left Ear: Tympanic membrane, ear canal and external ear normal. There is no impacted cerumen.     Nose: Nose normal.     Mouth/Throat:     Mouth: Mucous membranes are moist.  Eyes:     General: Lids are everted, no foreign bodies appreciated. No scleral icterus.       Left eye: No foreign body or hordeolum.     Conjunctiva/sclera: Conjunctivae normal.     Right eye: Right conjunctiva is not injected.     Left eye: Left conjunctiva is not injected.     Pupils: Pupils are equal, round, and reactive to light.  Neck:     Musculoskeletal: Normal range of motion and neck supple.     Thyroid: No thyromegaly.     Vascular: No JVD.     Trachea: No tracheal deviation.  Cardiovascular:     Rate and Rhythm: Normal rate and regular rhythm.     Heart sounds: Normal heart sounds. No murmur. No friction rub. No gallop.   Pulmonary:     Effort: Pulmonary effort is normal. No respiratory distress.     Breath sounds: Normal breath sounds. No wheezing or rales.  Abdominal:     General: Bowel sounds  are normal.     Palpations: Abdomen is soft. There is no mass.     Tenderness: There is no abdominal tenderness. There is no guarding or rebound.  Musculoskeletal: Normal range of motion.        General: No tenderness.  Lymphadenopathy:     Cervical: No cervical adenopathy.  Skin:    General: Skin is warm.     Findings: No rash.  Neurological:     Mental Status: She is alert and oriented to person, place, and time.     Cranial Nerves: No cranial nerve deficit.     Deep Tendon Reflexes: Reflexes normal.  Psychiatric:        Mood and Affect: Mood is not anxious or depressed.     Wt Readings from Last 3 Encounters:  01/30/19 191 lb (86.6 kg)  07/25/17 184 lb (83.5 kg)  08/07/16 190 lb (86.2 kg)    BP 120/80   Pulse 64   Ht _0  (1.676 m)   Wt 191 lb (86.6  kg)   BMI 30.83 kg/m    Assessment and Plan: 1. Establishing care with new doctor, encounter for Patient establishes care with a new physician.  Patient's previous encounters with specialist and primary care individuals were reviewed.  Patient also had review of previous imaging and labs pertinent nature.  2. Essential hypertension Chronic.  Controlled.  Continue chlorthalidone 25 mg once a day will recheck in 6 months - chlorthalidone (HYGROTON) 25 MG tablet; Take 1 tablet (25 mg total) by mouth daily.  Dispense: 90 tablet; Refill: 1  3. Atherosclerosis of coronary artery of native heart without angina pectoris, unspecified vessel or lesion type Chronic.  This was noted on what sounds like a coronary calcification study.  Patient was encouraged to maintain normal cholesterol and blood pressure concerns.  4. Osteoporosis, post-menopausal Patient has a history of osteoporosis for which she is receiving Prolia.  Will contact Dr. Lynett Fish concerning what may be an early administration of her Prolia to make sure that her labs and scheduling will be sufficiently met. - Ambulatory referral to Hematology / Oncology  5. Chronic left shoulder pain Patient has a chronic left shoulder pain status post surgery by Dr. Roland Rack.  There are multiple reasons for may be a tear to rotator cuff tear to probable scar tissue.  Will refer to orthopedics for evaluation of continuance of shoulder pain. - Ambulatory referral to Orthopedic Surgery  6. PA (pernicious anemia) Patient has a history of chronic B12 injections and pernicious anemia.  However this is not documented anywhere in the question is whether or not status post Medicare she is going to be able to have this paid for and whether this needs to be further documented by hematology. - Ambulatory referral to Hematology / Oncology  7. Gastroesophageal reflux disease with esophagitis Patient has a history of chronic reflux disease with esophagitis is noted  by her history of an endoscopy in [facility].  Patient is on high-dose pantoprazole 40 mg twice a day and will refer to GI for further evaluation and verification of possible Barrett's him history of peptic ulcer disease. - pantoprazole (PROTONIX) 40 MG tablet; Take 1 tablet (40 mg total) by mouth 2 (two) times daily.  Dispense: 180 tablet; Refill: 1 - Ambulatory referral to Gastroenterology  8. Barrett's esophagus without dysplasia Patient by her history has Barrett's esophagitis although this is not documented in any pathology nor GI procedure.  Will refer to gastroenterology for further evaluation if further  endoscopies are needed on a more frequent basis if she has Barrett's as well as the continuance of high-dose pantoprazole. - pantoprazole (PROTONIX) 40 MG tablet; Take 1 tablet (40 mg total) by mouth 2 (two) times daily.  Dispense: 180 tablet; Refill: 1 - Ambulatory referral to Gastroenterology

## 2019-02-05 ENCOUNTER — Ambulatory Visit: Payer: BLUE CROSS/BLUE SHIELD | Admitting: Gastroenterology

## 2019-02-20 ENCOUNTER — Other Ambulatory Visit: Payer: Self-pay | Admitting: Surgery

## 2019-02-20 DIAGNOSIS — M24112 Other articular cartilage disorders, left shoulder: Secondary | ICD-10-CM

## 2019-02-20 DIAGNOSIS — M7522 Bicipital tendinitis, left shoulder: Secondary | ICD-10-CM

## 2019-02-20 DIAGNOSIS — M19012 Primary osteoarthritis, left shoulder: Secondary | ICD-10-CM

## 2019-02-20 DIAGNOSIS — M7582 Other shoulder lesions, left shoulder: Secondary | ICD-10-CM

## 2019-03-06 ENCOUNTER — Ambulatory Visit
Admission: RE | Admit: 2019-03-06 | Discharge: 2019-03-06 | Disposition: A | Discharge status: Home / Self Care | Payer: BC Managed Care – PPO | Source: Ambulatory Visit | Attending: Surgery | Admitting: Surgery

## 2019-03-06 ENCOUNTER — Other Ambulatory Visit: Payer: Self-pay

## 2019-03-06 DIAGNOSIS — M24112 Other articular cartilage disorders, left shoulder: Secondary | ICD-10-CM | POA: Insufficient documentation

## 2019-03-06 DIAGNOSIS — M19012 Primary osteoarthritis, left shoulder: Secondary | ICD-10-CM | POA: Diagnosis present

## 2019-03-06 DIAGNOSIS — M7522 Bicipital tendinitis, left shoulder: Secondary | ICD-10-CM | POA: Diagnosis not present

## 2019-03-06 DIAGNOSIS — M7582 Other shoulder lesions, left shoulder: Secondary | ICD-10-CM | POA: Insufficient documentation

## 2019-03-11 ENCOUNTER — Other Ambulatory Visit: Payer: Self-pay

## 2019-03-11 ENCOUNTER — Encounter: Payer: Self-pay | Admitting: Gastroenterology

## 2019-03-11 ENCOUNTER — Ambulatory Visit (INDEPENDENT_AMBULATORY_CARE_PROVIDER_SITE_OTHER): Payer: BC Managed Care – PPO | Admitting: Gastroenterology

## 2019-03-11 VITALS — BP 147/86 | HR 75 | Temp 97.9°F | Wt 189.0 lb

## 2019-03-11 DIAGNOSIS — K2270 Barrett's esophagus without dysplasia: Secondary | ICD-10-CM | POA: Diagnosis not present

## 2019-03-11 DIAGNOSIS — K289 Gastrojejunal ulcer, unspecified as acute or chronic, without hemorrhage or perforation: Secondary | ICD-10-CM

## 2019-03-11 DIAGNOSIS — K219 Gastro-esophageal reflux disease without esophagitis: Secondary | ICD-10-CM | POA: Diagnosis not present

## 2019-03-11 NOTE — Progress Notes (Signed)
Brittney Darby, MD [address]  [place]  Brittney Stephens, [place] [address]  Main: [phone]  Fax: [phone]    Gastroenterology Consultation  Referring Provider:     Juline Patch, MD Primary Care Physician:  Juline Patch, MD Primary Gastroenterologist:  Dr. Cephas Stephens Reason for Consultation:    Barrett's esophagus        HPI:   Brittney Stephens is a 64 y.o. female referred by Dr. Juline Patch, MD  for consultation & management of Barrett's esophagus.  Patient had history of Roux-en-Y gastric bypass in 6387, complicated by recurrent anastomotic ulcer status post revision in 2007.  Patient reports that she has longstanding history of GERD and Barrett's esophagus for which she has been undergoing serial EGDs over the last few years both for Barrett's and anastomotic ulcers.  Patient has been on long-term PPI, currently on Protonix 40 mg twice daily.  She also has osteoporosis and is followed by Dr. Rogue Stephens at [place], receiving Prolia.  She reports heartburn, regurgitation when she tries to eat more than normal portion since her gastric bypass.  She also reports chronic nonbloody loose bowel movements.  She does not know if she was tested for celiac disease.  She denies any other GI symptoms including rectal bleeding, dysphagia, abdominal pain or bloating.  Her most recent labs from 06/2018 revealed normal hemoglobin, B12 and folate levels, normal CMP  NSAIDs: None  Antiplts/Anticoagulants/Anti thrombotics: None  GI Procedures: Reports undergoing EGD and colonoscopy in [place], [place] in early 2020.  Reports not available  Past Medical History:  Diagnosis Date  . Anemia    PERNICIOUS  . Arthritis    HANDS  . Bleeds easily (HCC)    NO DIAGNOSIS, LONGER THAN NORMAL  . CAD (coronary artery disease)    SLIGHT  . Complication of anesthesia    WAS NOT ASLEEP DEEP ENOUGH FOR ENDOSCOPY AT [place]  . GERD (gastroesophageal reflux  disease)    STOMACH ULCER  . Hypertension   . 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 Reflux   . RLS (restless legs syndrome)   . Rotator cuff disorder, left   . Vertigo    HX OF IN PAST  . White matter disease     Past Surgical History:  Procedure Laterality Date  . CARDIAC CATHETERIZATION     10-15 YEARS AGO  . CHOLECYSTECTOMY    . FACIAL COSMETIC SURGERY    . FOOT SURGERY    . GASTRIC BYPASS  2005  . OPEN REDUCTION INTERNAL FIXATION (ORIF) DISTAL RADIAL FRACTURE Left 01/26/2016   Procedure: OPEN REDUCTION INTERNAL FIXATION (ORIF) DISTAL RADIAL FRACTURE;  Surgeon: Hessie Knows, MD;  Location: [facility];  Service: Orthopedics;  Laterality: Left;  . RECTAL PROLAPSE REPAIR    . SHOULDER ARTHROSCOPY Left 07/25/2017   Procedure: ARTHROSCOPY SHOULDER WITH DEBRIDEMENT, DECOMPRESSION AND BICEPS TENODESIS;  Surgeon: Corky Mull, MD;  Location: [place];  Service: Orthopedics;  Laterality: Left;  . STOMACH SURGERY  2010   [facility] FOR [facility]LC[facility]  . TUBAL LIGATION      Current Outpatient Medications:  .  chlorthalidone (HYGROTON) 25 MG tablet, Take 1 tablet (25 mg total) by mouth daily., Disp: 90 tablet, Rfl: 1 .  cyanocobalamin (,VITAMIN B-12,) 1000 MCG/ML injection, Inject 1,000 mcg into the muscle every 30 (thirty) days. , Disp: , Rfl:  .  HYDROcodone-acetaminophen (NORCO) 7.5-325 MG tablet,  Take 1-2 tablets by mouth every 6 (six) hours as needed for moderate pain., Disp: 50 tablet, Rfl: 0 .  loperamide (IMODIUM A-D) 2 MG tablet, Take 4 mg by mouth once a week., Disp: , Rfl:  .  Multiple Vitamin (MULTIVITAMIN) tablet, Take 1 tablet by mouth daily., Disp: , Rfl:  .  pantoprazole (PROTONIX) 40 MG tablet, Take 1 tablet (40 mg total) by mouth 2 (two) times daily., Disp: 180 tablet, Rfl: 1 .  CALCIUM PO, Take 1 tablet by mouth daily. , Disp: , Rfl:    Family History  Problem Relation Age of Onset  . Diabetes Mother   . Hypertension Mother    . Diabetes Father   . Heart disease Father   . Hypertension Father   . Diabetes Sister   . Hypertension Sister   . Stroke Sister   . Diabetes Brother   . Hypertension Brother   . Cancer Paternal Grandfather   . Heart disease Maternal Grandmother   . Hypertension Maternal Grandmother   . Heart disease Maternal Grandfather      Social History   Tobacco Use  . Smoking status: Former Smoker    Packs/day: 2.00    Years: 38.00    Pack years: 76.00    Types: Cigarettes  . Smokeless tobacco: Never Used  . Tobacco comment: quit 10 years ago   Substance Use Topics  . Alcohol use: Yes    Alcohol/week: 9.0 standard drinks    Types: 7 Glasses of wine, 2 Standard drinks or equivalent per week  . Drug use: No    Allergies as of 03/11/2019 - Review Complete 03/11/2019  Allergen Reaction Noted  . Azithromycin Anaphylaxis 08/20/2014  . Erythromycin Other (See Comments) and Shortness Of Breath 08/20/2014  . Iodine Anaphylaxis 08/20/2014  . Shellfish allergy Anaphylaxis, Hives, and Shortness Of Breath 01/06/2013    Review of Systems:    All systems reviewed and negative except where noted in HPI.   Physical Exam:  BP (!) 147/86 (BP Location: Left Arm, Patient Position: Sitting, Cuff Size: Normal)   Pulse 75   Temp 97.9 F (36.6 C) (Oral)   Wt 189 lb (85.7 kg)   BMI 30.51 kg/m  No LMP recorded. Patient is postmenopausal.  General:   Alert,  Well-developed, well-nourished, pleasant and cooperative in NAD Head:  Normocephalic and atraumatic. Eyes:  Sclera clear, no icterus.   Conjunctiva pink. Ears:  Normal auditory acuity. Nose:  No deformity, discharge, or lesions. Mouth:  No deformity or lesions,oropharynx pink & moist. Neck:  Supple; no masses or thyromegaly. Lungs:  Respirations even and unlabored.  Clear throughout to auscultation.   No wheezes, crackles, or rhonchi. No acute distress. Heart:  Regular rate and rhythm; no murmurs, clicks, rubs, or gallops. Abdomen:   Normal bowel sounds. Soft, non-tender and non-distended without masses, hepatosplenomegaly or hernias noted.  No guarding or rebound tenderness.   Rectal: Not performed Msk:  Symmetrical without gross deformities. Good, equal movement & strength bilaterally. Pulses:  Normal pulses noted. Extremities:  No clubbing or edema.  No cyanosis. Neurologic:  Alert and oriented x3;  grossly normal neurologically. Skin:  Intact without significant lesions or rashes. No jaundice. Lymph Nodes:  No significant cervical adenopathy. Psych:  Alert and cooperative. Normal mood and affect.  Imaging Studies: No recent abdominal imaging  Assessment and Plan:   Nishi Neiswonger is a 63 y.o. female with history of Roux-en-Y gastric bypass in 2000, anastomotic ulcer status post revision in 2007, severe  osteoporosis, chronic GERD, Barrett's esophagus seen in consultation for management of Barrett's esophagus.  Patient also reports chronic diarrhea  Continue Protonix Continue antireflux measures We will obtain upper endoscopy and colonoscopy reports before giving further recommendations about the timing of upper endoscopy for Barrett's surveillance Recheck labs during next visit including screening for celiac disease Advised her to start taking bariatric multivitamin with minerals instead of regular multivitamin  Follow up in 1 month   Brittney Darby, MD

## 2019-03-24 ENCOUNTER — Telehealth: Payer: Self-pay | Admitting: Gastroenterology

## 2019-03-24 NOTE — Telephone Encounter (Signed)
Reviewed upper endoscopy and colonoscopy results from outside hospital performed in 06/2018  Apparently, there was no endoscopic evidence of Barrett's.  GE junction was only disrupted which was biopsied and showed Barrett's.  It was less than 1 cm in length.  Therefore, patient does not meet the criteria for surveillance for Barrett's esophagus which means she does not need an EGD.  There was no evidence of ulcer at the anastomosis as well.    Colonoscopy showed tubular adenoma.  Recommend surveillance colonoscopy in 7 years  Please call patient and convey my recommendations.  I will see her for follow-up as scheduled  Cephas Darby, MD [address]  [place]  [place], [place] [address]  Main: [phone]  Fax: [phone] Pager: [phone]

## 2019-03-25 NOTE — Telephone Encounter (Signed)
Called and left a detail message and sent mychart message

## 2019-04-16 ENCOUNTER — Ambulatory Visit: Payer: BLUE CROSS/BLUE SHIELD

## 2019-04-16 ENCOUNTER — Ambulatory Visit: Payer: BLUE CROSS/BLUE SHIELD | Admitting: Internal Medicine

## 2019-04-16 ENCOUNTER — Other Ambulatory Visit: Payer: BLUE CROSS/BLUE SHIELD

## 2019-04-22 ENCOUNTER — Ambulatory Visit (INDEPENDENT_AMBULATORY_CARE_PROVIDER_SITE_OTHER): Payer: Medicare Other | Admitting: Gastroenterology

## 2019-04-22 ENCOUNTER — Other Ambulatory Visit: Payer: Self-pay

## 2019-04-22 ENCOUNTER — Encounter: Payer: Self-pay | Admitting: Gastroenterology

## 2019-04-22 VITALS — BP 138/90 | HR 71 | Temp 98.0°F | Resp 17 | Ht 66.0 in | Wt 191.8 lb

## 2019-04-22 DIAGNOSIS — K529 Noninfective gastroenteritis and colitis, unspecified: Secondary | ICD-10-CM | POA: Diagnosis not present

## 2019-04-22 MED ORDER — CHOLESTYRAMINE 4 G PO PACK: 4.0000 g | PACK | Freq: Two times a day (BID) | ORAL | 2 refills | Status: AC

## 2019-04-22 NOTE — Progress Notes (Signed)
Cephas Darby, MD [address]  [place]  [place], [place] [address]  Main: [phone]  Fax: [phone]    Gastroenterology Consultation  Referring Provider:     Juline Patch, MD Primary Care Physician:  Juline Patch, MD Primary Gastroenterologist:  Dr. Cephas Darby Reason for Consultation:  Chronic diarrhea        HPI:   Brittney Stephens is a 63 y.o. female referred by Dr. Juline Patch, MD  for consultation & management of Barrett's esophagus.  Patient had history of Roux-en-Y gastric bypass in 6283, complicated by recurrent anastomotic ulcer status post revision in 2007.  Patient reports that she has longstanding history of GERD and Barrett's esophagus for which she has been undergoing serial EGDs over the last few years both for Barrett's and anastomotic ulcers.  Patient has been on long-term PPI, currently on Protonix 40 mg twice daily.  She also has osteoporosis and is followed by Dr. Rogue Bussing at [place], receiving Prolia.  She reports heartburn, regurgitation when she tries to eat more than normal portion since her gastric bypass.  She also reports chronic nonbloody loose bowel movements.  She does not know if she was tested for celiac disease.  She denies any other GI symptoms including rectal bleeding, dysphagia, abdominal pain or bloating.  Her most recent labs from 06/2018 revealed normal hemoglobin, B12 and folate levels, normal CMP  Follow-up visit 04/22/2019 Patient continues to have chronic nonbloody, watery diarrhea on and off that she has to wear depends.  Her initial bowel movement is generally solid followed by 4-5 watery bowel movements in the morning.  She does not have any other constitutional symptoms associated with diarrhea.  NSAIDs: None  Antiplts/Anticoagulants/Anti thrombotics: None  GI Procedures: Reports undergoing EGD and colonoscopy in [place], [place] in 06/2018.    Past Medical History:  Diagnosis Date  .  Anemia    PERNICIOUS  . Arthritis    HANDS  . Bleeds easily (HCC)    NO DIAGNOSIS, LONGER THAN NORMAL  . CAD (coronary artery disease)    SLIGHT  . Complication of anesthesia    WAS NOT ASLEEP DEEP ENOUGH FOR ENDOSCOPY AT [place] [facility]  . GERD (gastroesophageal reflux disease)    STOMACH ULCER  . Hypertension   . 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 Reflux   . RLS (restless legs syndrome)   . Rotator cuff disorder, left   . Vertigo    HX OF IN PAST  . White matter disease     Past Surgical History:  Procedure Laterality Date  . CARDIAC CATHETERIZATION     10-15 YEARS AGO  . CHOLECYSTECTOMY    . FACIAL COSMETIC SURGERY    . FOOT SURGERY    . GASTRIC BYPASS  2005  . OPEN REDUCTION INTERNAL FIXATION (ORIF) DISTAL RADIAL FRACTURE Left 01/26/2016   Procedure: OPEN REDUCTION INTERNAL FIXATION (ORIF) DISTAL RADIAL FRACTURE;  Surgeon: Hessie Knows, MD;  Location: [facility];  Service: Orthopedics;  Laterality: Left;  . RECTAL PROLAPSE REPAIR    . SHOULDER ARTHROSCOPY Left 07/25/2017   Procedure: ARTHROSCOPY SHOULDER WITH DEBRIDEMENT, DECOMPRESSION AND BICEPS TENODESIS;  Surgeon: Corky Mull, MD;  Location: [facility];  Service: Orthopedics;  Laterality: Left;  . STOMACH SURGERY  2010   [facility] FOR [facility]LC[facility]  . TUBAL LIGATION      Current Outpatient Medications:  .  chlorthalidone (HYGROTON) 25 MG  tablet, Take 1 tablet (25 mg total) by mouth daily., Disp: 90 tablet, Rfl: 1 .  cyanocobalamin (,VITAMIN B-12,) 1000 MCG/ML injection, Inject 1,000 mcg into the muscle every 30 (thirty) days. , Disp: , Rfl:  .  loperamide (IMODIUM A-D) 2 MG tablet, Take 4 mg by mouth once a week., Disp: , Rfl:  .  Multiple Vitamin (MULTIVITAMIN) tablet, Take 1 tablet by mouth daily., Disp: , Rfl:  .  pantoprazole (PROTONIX) 40 MG tablet, Take 1 tablet (40 mg total) by mouth 2 (two) times daily., Disp: 180 tablet, Rfl: 1 .  calcium carbonate  (OS-CAL) 600 MG TABS tablet, Take by mouth., Disp: , Rfl:  .  CALCIUM PO, Take 1 tablet by mouth daily. , Disp: , Rfl:  .  cholestyramine (QUESTRAN) 4 g packet, Take 1 packet (4 g total) by mouth 2 (two) times daily., Disp: 60 packet, Rfl: 2 .  HYDROcodone-acetaminophen (NORCO) 7.5-325 MG tablet, Take 1-2 tablets by mouth every 6 (six) hours as needed for moderate pain. (Patient not taking: Reported on 04/22/2019), Disp: 50 tablet, Rfl: 0   Family History  Problem Relation Age of Onset  . Diabetes Mother   . Hypertension Mother   . Diabetes Father   . Heart disease Father   . Hypertension Father   . Diabetes Sister   . Hypertension Sister   . Stroke Sister   . Diabetes Brother   . Hypertension Brother   . Cancer Paternal Grandfather   . Heart disease Maternal Grandmother   . Hypertension Maternal Grandmother   . Heart disease Maternal Grandfather      Social History   Tobacco Use  . Smoking status: Former Smoker    Packs/day: 2.00    Years: 38.00    Pack years: 76.00    Types: Cigarettes  . Smokeless tobacco: Never Used  . Tobacco comment: quit 10 years ago   Substance Use Topics  . Alcohol use: Yes    Alcohol/week: 9.0 standard drinks    Types: 7 Glasses of wine, 2 Standard drinks or equivalent per week  . Drug use: No    Allergies as of 04/22/2019 - Review Complete 04/22/2019  Allergen Reaction Noted  . Azithromycin Anaphylaxis 08/20/2014  . Erythromycin Other (See Comments) and Shortness Of Breath 08/20/2014  . Iodine Anaphylaxis 08/20/2014  . Shellfish allergy Anaphylaxis, Hives, and Shortness Of Breath 01/06/2013    Review of Systems:    All systems reviewed and negative except where noted in HPI.   Physical Exam:  BP 138/90 (BP Location: Left Arm, Patient Position: Sitting, Cuff Size: Large)   Pulse 71   Temp 98 F (36.7 C)   Resp 17   Ht 5\' 6"  (1.676 m)   Wt 191 lb 12.8 oz (87 kg)   BMI 30.96 kg/m  No LMP recorded. Patient is postmenopausal.   General:   Alert,  Well-developed, well-nourished, pleasant and cooperative in NAD Head:  Normocephalic and atraumatic. Eyes:  Sclera clear, no icterus.   Conjunctiva pink. Ears:  Normal auditory acuity. Nose:  No deformity, discharge, or lesions. Mouth:  No deformity or lesions,oropharynx pink & moist. Neck:  Supple; no masses or thyromegaly. Lungs:  Respirations even and unlabored.  Clear throughout to auscultation.   No wheezes, crackles, or rhonchi. No acute distress. Heart:  Regular rate and rhythm; no murmurs, clicks, rubs, or gallops. Abdomen:  Normal bowel sounds. Soft, non-tender and non-distended without masses, hepatosplenomegaly or hernias noted.  No guarding or rebound tenderness.  Rectal: Not performed Msk:  Symmetrical without gross deformities. Good, equal movement & strength bilaterally. Pulses:  Normal pulses noted. Extremities:  No clubbing or edema.  No cyanosis. Neurologic:  Alert and oriented x3;  grossly normal neurologically. Skin:  Intact without significant lesions or rashes. No jaundice. Psych:  Alert and cooperative. Normal mood and affect.  Imaging Studies: No recent abdominal imaging  Assessment and Plan:   Brittney Stephens is a 63 y.o. female with history of Roux-en-Y gastric bypass in 2000, anastomotic ulcer status post revision in 2007, severe osteoporosis, chronic GERD, history of Barrett's esophagus seen for follow-up of chronic diarrhea  ?Barrett's esophagus Apparently, there was no endoscopic evidence of Barrett's.  GE junction was only disrupted which was biopsied and showed Barrett's.  It was less than 1 cm in length.  Therefore, patient does not meet the criteria for surveillance for Barrett's esophagus which means she does not need an EGD.  There was no evidence of ulcer at the anastomosis as well.    Chronic watery diarrhea Check celiac disease panel Pancreatic fecal elastase levels Trial of cholestyramine 2 times a day Discussed with her  about flexible sigmoidoscopy to evaluate for microscopic colitis if patient does not respond to cholestyramine and if above work-up negative  Colon cancer screening Colonoscopy showed tubular adenoma.  Recommend surveillance colonoscopy in 7 years which is 06/2025   Follow up in 2 to 3 months   Cephas Darby, MD

## 2019-04-24 ENCOUNTER — Other Ambulatory Visit: Payer: BLUE CROSS/BLUE SHIELD

## 2019-04-24 ENCOUNTER — Ambulatory Visit: Payer: BLUE CROSS/BLUE SHIELD | Admitting: Internal Medicine

## 2019-04-24 ENCOUNTER — Ambulatory Visit: Payer: BLUE CROSS/BLUE SHIELD

## 2019-04-24 LAB — CELIAC DISEASE PANEL
Endomysial IgA: NEGATIVE
IgA/Immunoglobulin A, Serum: 118 mg/dL (ref 87–352)
Transglutaminase IgA: <2 U/mL (ref 0–3)

## 2019-05-08 ENCOUNTER — Other Ambulatory Visit: Payer: Self-pay | Admitting: *Deleted

## 2019-05-08 DIAGNOSIS — M810 Age-related osteoporosis without current pathological fracture: Secondary | ICD-10-CM

## 2019-05-08 NOTE — Progress Notes (Signed)
Left vm for patient to return my phone call for prescreening covid questions

## 2019-05-09 ENCOUNTER — Encounter: Payer: Self-pay | Admitting: Internal Medicine

## 2019-05-09 ENCOUNTER — Other Ambulatory Visit: Payer: Self-pay

## 2019-05-09 ENCOUNTER — Inpatient Hospital Stay: Payer: Medicare Other

## 2019-05-09 ENCOUNTER — Inpatient Hospital Stay (HOSPITAL_BASED_OUTPATIENT_CLINIC_OR_DEPARTMENT_OTHER): Payer: Medicare Other | Admitting: Internal Medicine

## 2019-05-09 ENCOUNTER — Inpatient Hospital Stay: Payer: Medicare Other | Attending: Internal Medicine

## 2019-05-09 DIAGNOSIS — M810 Age-related osteoporosis without current pathological fracture: Secondary | ICD-10-CM

## 2019-05-09 DIAGNOSIS — M818 Other osteoporosis without current pathological fracture: Secondary | ICD-10-CM | POA: Diagnosis present

## 2019-05-09 DIAGNOSIS — I2510 Atherosclerotic heart disease of native coronary artery without angina pectoris: Secondary | ICD-10-CM

## 2019-05-09 LAB — COMPREHENSIVE METABOLIC PANEL
ALT: 13 U/L (ref 0–44)
AST: 19 U/L (ref 15–41)
Albumin: 4.1 g/dL (ref 3.5–5.0)
Alkaline Phosphatase: 62 U/L (ref 38–126)
Anion gap: 6 (ref 5–15)
BUN: 11 mg/dL (ref 8–23)
CO2: 26 mmol/L (ref 22–32)
Calcium: 8.6 mg/dL — ABNORMAL LOW (ref 8.9–10.3)
Chloride: 103 mmol/L (ref 98–111)
Creatinine, Ser: 0.89 mg/dL (ref 0.44–1.00)
GFR calc Af Amer: >60 mL/min (ref >60)
GFR calc non Af Amer: >60 mL/min (ref >60)
Glucose, Bld: 92 mg/dL (ref 70–99)
Potassium: 3.9 mmol/L (ref 3.5–5.1)
Sodium: 135 mmol/L (ref 135–145)
Total Bilirubin: 0.8 mg/dL (ref 0.3–1.2)
Total Protein: 6.8 g/dL (ref 6.5–8.1)

## 2019-05-09 LAB — CBC WITH DIFFERENTIAL/PLATELET
Abs Immature Granulocytes: 0.02 10*3/uL (ref 0.00–0.07)
Basophils Absolute: 0 10*3/uL (ref 0.0–0.1)
Basophils Relative: 1 %
Eosinophils Absolute: 0.1 10*3/uL (ref 0.0–0.5)
Eosinophils Relative: 2 %
HCT: 35.5 % — ABNORMAL LOW (ref 36.0–46.0)
Hemoglobin: 11.5 g/dL — ABNORMAL LOW (ref 12.0–15.0)
Immature Granulocytes: 0 %
Lymphocytes Relative: 31 %
Lymphs Abs: 1.6 10*3/uL (ref 0.7–4.0)
MCH: 29.1 pg (ref 26.0–34.0)
MCHC: 32.4 g/dL (ref 30.0–36.0)
MCV: 89.9 fL (ref 80.0–100.0)
Monocytes Absolute: 0.5 10*3/uL (ref 0.1–1.0)
Monocytes Relative: 10 %
Neutro Abs: 2.8 10*3/uL (ref 1.7–7.7)
Neutrophils Relative %: 56 %
Platelets: 212 10*3/uL (ref 150–400)
RBC: 3.95 MIL/uL (ref 3.87–5.11)
RDW: 13.7 % (ref 11.5–15.5)
WBC: 5.1 10*3/uL (ref 4.0–10.5)
nRBC: 0 % (ref 0.0–0.2)

## 2019-05-09 MED ORDER — DENOSUMAB 60 MG/ML ~~LOC~~ SOSY
60.0000 mg | PREFILLED_SYRINGE | Freq: Once | SUBCUTANEOUS | Status: AC
  Administered 2019-05-09: 60 mg via SUBCUTANEOUS
  Filled 2019-05-09: qty 1

## 2019-05-09 NOTE — Assessment & Plan Note (Addendum)
#   Symptomatic osteoporosis-compression fractures/traumatic fractures given risk of fall.  March 2020-BMD -2.5 as per patient continue Prolia every 6 months.  Again reviewed the potential side effects including dental issues.  #Hypocalcemia calcium 8.6-recommend increasing calcium plus vitamin D to twice a day.;  Okay with Prolia.  #Mild anemia hemoglobin 11.5-question etiology check iron studies ferritin; history of B12 deficiency continue B12 injections.  #Question malabsorption-iron/B12 deficiency on B12 injections.   #History of smoking/March 2020  [[facility]]lung cancer screening CT scan benign-appearing small pulmonary nodules category 2.  repeat imaging in 12 months-we will inform lung cancer screening program.  # DISPOSITION: add iron studies/ ferritin # prolia today #Follow-up 6 months-MD; CBC CMP-Prolia- Dr.B  # cc; [place]/ Dara Lords

## 2019-05-09 NOTE — Progress Notes (Signed)
[place] NOTE  Patient Care Team: Juline Patch, MD as PCP - General (Family Medicine)  CHIEF COMPLAINTS/PURPOSE OF CONSULTATION: Osteoporosis  # # Dr. Rona Ravens previous doctor's office/ [place]-August 21, 2018-bone density lumbar spine T score -2.6/consistent with osteoporosis/bilateral mammogram negative-on Prolia  #History of gastric bypass-question malabsorption B12 injections  # CT lung nodules [hx of smoking]-lung cancer screening program//[place]  # Colonoscopy-s/p polyp resection [ in 2020]  Oncology History   No history exists.     HISTORY OF PRESENTING ILLNESS:  Brittney Stephens 63 y.o.  female with history of osteoporosis currently on Prolia; history of smoking currently on lung cancer screening program is here for follow-up.  Patient denies any blood in stools or black or stools but denies any nausea vomiting abdominal pain.  Patient states her colonoscopy in 2020 in [place].  Preoperative good pain no weight loss.  No bone pain.  No new compression fractures.  Denies having any dental extractions done   Review of Systems  Constitutional: Negative for chills, diaphoresis, fever, malaise/fatigue and weight loss.  HENT: Negative for nosebleeds and sore throat.   Eyes: Negative for double vision.  Respiratory: Negative for cough, hemoptysis, sputum production, shortness of breath and wheezing.   Cardiovascular: Negative for chest pain, palpitations, orthopnea and leg swelling.  Gastrointestinal: Negative for abdominal pain, blood in stool, constipation, diarrhea, heartburn, melena, nausea and vomiting.  Genitourinary: Negative for dysuria, frequency and urgency.  Musculoskeletal: Negative for back pain and joint pain.  Skin: Negative.  Negative for itching and rash.  Neurological: Negative for dizziness, tingling, focal weakness, weakness and headaches.  Endo/Heme/Allergies: Does not bruise/bleed easily.   Psychiatric/Behavioral: Negative for depression. The patient is not nervous/anxious and does not have insomnia.      MEDICAL HISTORY:  Past Medical History:  Diagnosis Date  . Anemia    PERNICIOUS  . Arthritis    HANDS  . Bleeds easily (HCC)    NO DIAGNOSIS, LONGER THAN NORMAL  . CAD (coronary artery disease)    SLIGHT  . Complication of anesthesia    WAS NOT ASLEEP DEEP ENOUGH FOR ENDOSCOPY AT [facility]  . GERD (gastroesophageal reflux disease)    STOMACH ULCER  . Hypertension   . 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 Reflux   . RLS (restless legs syndrome)   . Rotator cuff disorder, left   . Vertigo    HX OF IN PAST  . White matter disease     SURGICAL HISTORY: Past Surgical History:  Procedure Laterality Date  . CARDIAC CATHETERIZATION     10-15 YEARS AGO  . CHOLECYSTECTOMY    . FACIAL COSMETIC SURGERY    . FOOT SURGERY    . GASTRIC BYPASS  2005  . OPEN REDUCTION INTERNAL FIXATION (ORIF) DISTAL RADIAL FRACTURE Left 01/26/2016   Procedure: OPEN REDUCTION INTERNAL FIXATION (ORIF) DISTAL RADIAL FRACTURE;  Surgeon: Hessie Knows, MD;  Location: [facility];  Service: Orthopedics;  Laterality: Left;  . RECTAL PROLAPSE REPAIR    . SHOULDER ARTHROSCOPY Left 07/25/2017   Procedure: ARTHROSCOPY SHOULDER WITH DEBRIDEMENT, DECOMPRESSION AND BICEPS TENODESIS;  Surgeon: Corky Mull, MD;  Location: [facility];  Service: Orthopedics;  Laterality: Left;  . STOMACH SURGERY  2010   [facility]  . TUBAL LIGATION      SOCIAL HISTORY: Social History   Socioeconomic History  . Marital status: Married    Spouse  name: Not on file  . Number of children: Not on file  . Years of education: Not on file  . Highest education level: Not on file  Occupational History  . Not on file  Social Needs  . Financial resource strain: Not on file  . Food insecurity    Worry: Not on file    Inability: Not on file  .  Transportation needs    Medical: Not on file    Non-medical: Not on file  Tobacco Use  . Smoking status: Former Smoker    Packs/day: 2.00    Years: 38.00    Pack years: 76.00    Types: Cigarettes  . Smokeless tobacco: Never Used  . Tobacco comment: quit 10 years ago   Substance and Sexual Activity  . Alcohol use: Yes    Alcohol/week: 9.0 standard drinks    Types: 7 Glasses of wine, 2 Standard drinks or equivalent per week  . Drug use: No  . Sexual activity: Yes  Lifestyle  . Physical activity    Days per week: Not on file    Minutes per session: Not on file  . Stress: Not on file  Relationships  . Social Herbalist on phone: Not on file    Gets together: Not on file    Attends religious service: Not on file    Active member of club or organization: Not on file    Attends meetings of clubs or organizations: Not on file    Relationship status: Not on file  . Intimate partner violence    Fear of current or ex partner: Not on file    Emotionally abused: Not on file    Physically abused: Not on file    Forced sexual activity: Not on file  Other Topics Concern  . Not on file  Social History Narrative  . Not on file    FAMILY HISTORY: Family History  Problem Relation Age of Onset  . Diabetes Mother   . Hypertension Mother   . Diabetes Father   . Heart disease Father   . Hypertension Father   . Diabetes Sister   . Hypertension Sister   . Stroke Sister   . Diabetes Brother   . Hypertension Brother   . Cancer Paternal Grandfather   . Heart disease Maternal Grandmother   . Hypertension Maternal Grandmother   . Heart disease Maternal Grandfather     ALLERGIES:  is allergic to azithromycin; erythromycin; iodine; and shellfish allergy.  MEDICATIONS:  Current Outpatient Medications  Medication Sig Dispense Refill  . CALCIUM PO Take 1 tablet by mouth daily.     . chlorthalidone (HYGROTON) 25 MG tablet Take 1 tablet (25 mg total) by mouth daily. 90 tablet 1   . cholestyramine (QUESTRAN) 4 g packet Take 1 packet (4 g total) by mouth 2 (two) times daily. 60 packet 2  . cyanocobalamin (,VITAMIN B-12,) 1000 MCG/ML injection Inject 1,000 mcg into the muscle every 30 (thirty) days.     Marland Kitchen HYDROcodone-acetaminophen (NORCO) 7.5-325 MG tablet Take 1-2 tablets by mouth every 6 (six) hours as needed for moderate pain. 50 tablet 0  . loperamide (IMODIUM A-D) 2 MG tablet Take 4 mg by mouth once a week.    . Multiple Vitamin (MULTIVITAMIN) tablet Take 1 tablet by mouth daily.    . pantoprazole (PROTONIX) 40 MG tablet Take 1 tablet (40 mg total) by mouth 2 (two) times daily. 180 tablet 1  . calcium carbonate (OS-CAL) 600  MG TABS tablet Take by mouth.     No current facility-administered medications for this visit.    Facility-Administered Medications Ordered in Other Visits  Medication Dose Route Frequency Provider Last Rate Last Dose  . denosumab (PROLIA) injection 60 mg  60 mg Subcutaneous Once Charlaine Dalton R, MD       PHYSICAL EXAMINATION:   Vitals:   05/09/19 1000  BP: (!) 156/82  Pulse: 66  Temp: 97.6 F (36.4 C)   Filed Weights   05/09/19 1000  Weight: 191 lb (86.6 kg)    Physical Exam  Constitutional: She is oriented to person, place, and time and well-developed, well-nourished, and in no distress.  HENT:  Head: Normocephalic and atraumatic.  Mouth/Throat: Oropharynx is clear and moist. No oropharyngeal exudate.  Eyes: Pupils are equal, round, and reactive to light.  Neck: Normal range of motion. Neck supple.  Cardiovascular: Normal rate and regular rhythm.  Pulmonary/Chest: Effort normal and breath sounds normal. No respiratory distress. She has no wheezes.  Abdominal: Soft. Bowel sounds are normal. She exhibits no distension and no mass. There is no abdominal tenderness. There is no rebound and no guarding.  Musculoskeletal: Normal range of motion.        General: No tenderness or edema.  Neurological: She is alert and oriented  to person, place, and time.  Skin: Skin is warm.  Psychiatric: Affect normal.     LABORATORY DATA:  I have reviewed the data as listed Lab Results  Component Value Date   WBC 5.1 05/09/2019   HGB 11.5 (L) 05/09/2019   HCT 35.5 (L) 05/09/2019   MCV 89.9 05/09/2019   PLT 212 05/09/2019   Recent Labs    05/09/19 0945  NA 135  K 3.9  CL 103  CO2 26  GLUCOSE 92  BUN 11  CREATININE 0.89  CALCIUM 8.6*  GFRNONAA >60  GFRAA >60  PROT 6.8  ALBUMIN 4.1  AST 19  ALT 13  ALKPHOS 62  BILITOT 0.8    RADIOGRAPHIC STUDIES: I have personally reviewed the radiological images as listed and agreed with the findings in the report. No results found.  ASSESSMENT & PLAN:   Osteoporosis, post-menopausal # Symptomatic osteoporosis-compression fractures/traumatic fractures given risk of fall.  March 2020-BMD -2.5 as per patient continue Prolia every 6 months.  Again reviewed the potential side effects including dental issues.  #Hypocalcemia calcium 8.6-recommend increasing calcium plus vitamin D to twice a day.;  Okay with Prolia.  #Mild anemia hemoglobin 11.5-question etiology check iron studies ferritin; history of B12 deficiency continue B12 injections.  #Question malabsorption-iron/B12 deficiency on B12 injections.   #History of smoking/March 2020  [[place]]lung cancer screening CT scan benign-appearing small pulmonary nodules category 2.  repeat imaging in 12 months-we will inform lung cancer screening program.  # DISPOSITION: add iron studies/ ferritin # prolia today #Follow-up 6 months-MD; CBC CMP-Prolia- Dr.B  # cc; Hayely/ Dara Lords  All questions were answered. The patient knows to call the clinic with any problems, questions or concerns.    Cammie Sickle, MD 05/09/2019 10:47 AM

## 2019-06-24 DIAGNOSIS — M7542 Impingement syndrome of left shoulder: Secondary | ICD-10-CM | POA: Diagnosis not present

## 2019-06-24 DIAGNOSIS — M7522 Bicipital tendinitis, left shoulder: Secondary | ICD-10-CM | POA: Diagnosis not present

## 2019-07-21 ENCOUNTER — Other Ambulatory Visit: Payer: Self-pay

## 2019-07-21 ENCOUNTER — Telehealth: Payer: Self-pay | Admitting: Family Medicine

## 2019-07-21 ENCOUNTER — Telehealth: Payer: Self-pay

## 2019-07-21 DIAGNOSIS — I10 Essential (primary) hypertension: Secondary | ICD-10-CM

## 2019-07-21 MED ORDER — CHLORTHALIDONE 25 MG PO TABS: 25.0000 mg | ORAL_TABLET | Freq: Every day | ORAL | 0 refills | Status: AC

## 2019-07-21 NOTE — Telephone Encounter (Signed)
Needs refill sent to [facility] on [address]   chlorthalidone (HYGROTON) 25 MG tablet [[id number]]

## 2019-07-21 NOTE — Telephone Encounter (Signed)
Was supposed to come in to be seen this month? See if you can get her in this week, then we will send med in

## 2019-07-21 NOTE — Telephone Encounter (Signed)
Pt requesting chlorthalidone refill- pt was a new pt in August and has a high b/p reading. She needs to be seen for her 6 month follow up and a b/p recheck. We have notified her that we can send in a weeks worth of med, but need to see her this week. Spoke to [facility]

## 2019-07-21 NOTE — Telephone Encounter (Signed)
She has a lot of training so can come in, she will call back she said

## 2019-07-21 NOTE — Progress Notes (Unsigned)
Sent in 1 week med/ chlorthal.

## 2019-07-22 ENCOUNTER — Ambulatory Visit: Payer: Medicare Other | Admitting: Gastroenterology

## 2019-07-23 DIAGNOSIS — H40033 Anatomical narrow angle, bilateral: Secondary | ICD-10-CM | POA: Diagnosis not present

## 2019-07-24 ENCOUNTER — Ambulatory Visit: Payer: BC Managed Care – PPO | Admitting: Family Medicine

## 2019-11-06 ENCOUNTER — Inpatient Hospital Stay: Payer: Medicare Other | Admitting: Internal Medicine

## 2019-11-06 ENCOUNTER — Inpatient Hospital Stay: Payer: Medicare Other

## 2019-11-27 ENCOUNTER — Inpatient Hospital Stay: Payer: Medicare Other

## 2019-11-27 ENCOUNTER — Other Ambulatory Visit: Payer: Self-pay

## 2019-11-27 ENCOUNTER — Inpatient Hospital Stay: Payer: Medicare Other | Attending: Internal Medicine | Admitting: Internal Medicine

## 2019-11-27 ENCOUNTER — Other Ambulatory Visit: Payer: Self-pay | Admitting: *Deleted

## 2019-11-27 ENCOUNTER — Encounter: Payer: Self-pay | Admitting: Internal Medicine

## 2019-11-27 VITALS — Temp 97.1°F | Wt 193.0 lb

## 2019-11-27 DIAGNOSIS — M810 Age-related osteoporosis without current pathological fracture: Secondary | ICD-10-CM

## 2019-11-27 DIAGNOSIS — D509 Iron deficiency anemia, unspecified: Secondary | ICD-10-CM | POA: Insufficient documentation

## 2019-11-27 DIAGNOSIS — D510 Vitamin B12 deficiency anemia due to intrinsic factor deficiency: Secondary | ICD-10-CM | POA: Diagnosis not present

## 2019-11-27 DIAGNOSIS — Z9884 Bariatric surgery status: Secondary | ICD-10-CM | POA: Diagnosis not present

## 2019-11-27 DIAGNOSIS — M818 Other osteoporosis without current pathological fracture: Secondary | ICD-10-CM | POA: Insufficient documentation

## 2019-11-27 LAB — COMPREHENSIVE METABOLIC PANEL
ALT: 15 U/L (ref 0–44)
AST: 22 U/L (ref 15–41)
Albumin: 4.2 g/dL (ref 3.5–5.0)
Alkaline Phosphatase: 79 U/L (ref 38–126)
Anion gap: 9 (ref 5–15)
BUN: 10 mg/dL (ref 8–23)
CO2: 25 mmol/L (ref 22–32)
Calcium: 8.6 mg/dL — ABNORMAL LOW (ref 8.9–10.3)
Chloride: 106 mmol/L (ref 98–111)
Creatinine, Ser: 0.83 mg/dL (ref 0.44–1.00)
GFR calc Af Amer: >60 mL/min (ref >60)
GFR calc non Af Amer: >60 mL/min (ref >60)
Glucose, Bld: 130 mg/dL — ABNORMAL HIGH (ref 70–99)
Potassium: 3.3 mmol/L — ABNORMAL LOW (ref 3.5–5.1)
Sodium: 140 mmol/L (ref 135–145)
Total Bilirubin: 0.7 mg/dL (ref 0.3–1.2)
Total Protein: 7.2 g/dL (ref 6.5–8.1)

## 2019-11-27 LAB — CBC WITH DIFFERENTIAL/PLATELET
Abs Immature Granulocytes: 0.02 10*3/uL (ref 0.00–0.07)
Basophils Absolute: 0 10*3/uL (ref 0.0–0.1)
Basophils Relative: 1 %
Eosinophils Absolute: 0.1 10*3/uL (ref 0.0–0.5)
Eosinophils Relative: 2 %
HCT: 37.7 % (ref 36.0–46.0)
Hemoglobin: 12.4 g/dL (ref 12.0–15.0)
Immature Granulocytes: 0 %
Lymphocytes Relative: 28 %
Lymphs Abs: 1.5 10*3/uL (ref 0.7–4.0)
MCH: 29.2 pg (ref 26.0–34.0)
MCHC: 32.9 g/dL (ref 30.0–36.0)
MCV: 88.7 fL (ref 80.0–100.0)
Monocytes Absolute: 0.4 10*3/uL (ref 0.1–1.0)
Monocytes Relative: 8 %
Neutro Abs: 3.3 10*3/uL (ref 1.7–7.7)
Neutrophils Relative %: 61 %
Platelets: 216 10*3/uL (ref 150–400)
RBC: 4.25 MIL/uL (ref 3.87–5.11)
RDW: 14.1 % (ref 11.5–15.5)
WBC: 5.4 10*3/uL (ref 4.0–10.5)
nRBC: 0 % (ref 0.0–0.2)

## 2019-11-27 LAB — IRON AND TIBC
Iron: 40 ug/dL (ref 28–170)
Saturation Ratios: 12 % (ref 10.4–31.8)
TIBC: 342 ug/dL (ref 250–450)
UIBC: 302 ug/dL

## 2019-11-27 LAB — FERRITIN: Ferritin: 7 ng/mL — ABNORMAL LOW (ref 11–307)

## 2019-11-27 MED ORDER — DENOSUMAB 60 MG/ML ~~LOC~~ SOSY
60.0000 mg | PREFILLED_SYRINGE | Freq: Once | SUBCUTANEOUS | Status: AC
  Administered 2019-11-27: 60 mg via SUBCUTANEOUS
  Filled 2019-11-27: qty 1

## 2019-11-27 NOTE — Progress Notes (Signed)
[place] NOTE  Patient Care Team: Juline Patch, MD as PCP - General (Family Medicine)  CHIEF COMPLAINTS/PURPOSE OF CONSULTATION: Osteoporosis  # # Dr. Rona Ravens previous doctor's office/ [place]-August 21, 2018-bone density lumbar spine T score -2.6/consistent with osteoporosis/bilateral mammogram negative-on Prolia  #History of gastric bypass-question malabsorption B12 injections  # CT lung nodules [hx of smoking]-lung cancer screening program//[place]  # Colonoscopy-s/p polyp resection [[place] in 2020]  Oncology History   No history exists.    HISTORY OF PRESENTING ILLNESS:  Brittney Stephens 64 y.o.  female with history of osteoporosis currently on Prolia; history of smoking is here for follow-up.  Patient complains of fatigue.  Denies any abdominal pain nausea vomiting.  No blood in stools or black stools.  She unfortunately continues to smoke.   Chronic back pain not any worse.  Review of Systems  Constitutional: Positive for malaise/fatigue. Negative for chills, diaphoresis, fever and weight loss.  HENT: Negative for nosebleeds and sore throat.   Eyes: Negative for double vision.  Respiratory: Negative for cough, hemoptysis, sputum production, shortness of breath and wheezing.   Cardiovascular: Negative for chest pain, palpitations, orthopnea and leg swelling.  Gastrointestinal: Negative for abdominal pain, blood in stool, constipation, diarrhea, heartburn, melena, nausea and vomiting.  Genitourinary: Negative for dysuria, frequency and urgency.  Musculoskeletal: Positive for back pain and joint pain.  Skin: Negative.  Negative for itching and rash.  Neurological: Negative for dizziness, tingling, focal weakness, weakness and headaches.  Endo/Heme/Allergies: Does not bruise/bleed easily.  Psychiatric/Behavioral: Negative for depression. The patient is not nervous/anxious and does not have insomnia.      MEDICAL HISTORY:  Past Medical  History:  Diagnosis Date  . Anemia    PERNICIOUS  . Arthritis    HANDS  . Bleeds easily (HCC)    NO DIAGNOSIS, LONGER THAN NORMAL  . CAD (coronary artery disease)    SLIGHT  . Complication of anesthesia    WAS NOT ASLEEP DEEP ENOUGH FOR ENDOSCOPY AT [place]  . GERD (gastroesophageal reflux disease)    STOMACH ULCER  . Hypertension   . 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 Reflux   . RLS (restless legs syndrome)   . Rotator cuff disorder, left   . Vertigo    HX OF IN PAST  . White matter disease     SURGICAL HISTORY: Past Surgical History:  Procedure Laterality Date  . CARDIAC CATHETERIZATION     10-15 YEARS AGO  . CHOLECYSTECTOMY    . FACIAL COSMETIC SURGERY    . FOOT SURGERY    . GASTRIC BYPASS  2005  . OPEN REDUCTION INTERNAL FIXATION (ORIF) DISTAL RADIAL FRACTURE Left 01/26/2016   Procedure: OPEN REDUCTION INTERNAL FIXATION (ORIF) DISTAL RADIAL FRACTURE;  Surgeon: Hessie Knows, MD;  Location: [facility];  Service: Orthopedics;  Laterality: Left;  . RECTAL PROLAPSE REPAIR    . SHOULDER ARTHROSCOPY Left 07/25/2017   Procedure: ARTHROSCOPY SHOULDER WITH DEBRIDEMENT, DECOMPRESSION AND BICEPS TENODESIS;  Surgeon: Corky Mull, MD;  Location: [facility];  Service: Orthopedics;  Laterality: Left;  . STOMACH SURGERY  2010   [facility]  . TUBAL LIGATION      SOCIAL HISTORY: Social History   Socioeconomic History  . Marital status: Married    Spouse name: Not on file  . Number of children: Not on file  . Years of education: Not on file  . Highest  education level: Not on file  Occupational History  . Not on file  Tobacco Use  . Smoking status: Former Smoker    Packs/day: 2.00    Years: 38.00    Pack years: 76.00    Types: Cigarettes  . Smokeless tobacco: Never Used  . Tobacco comment: quit 10 years ago   Substance and Sexual Activity  . Alcohol use: Yes    Alcohol/week: 9.0 standard drinks     Types: 7 Glasses of wine, 2 Standard drinks or equivalent per week  . Drug use: No  . Sexual activity: Yes  Other Topics Concern  . Not on file  Social History Narrative  . Not on file   Social Determinants of Health   Financial Resource Strain:   . Difficulty of Paying Living Expenses:   Food Insecurity:   . Worried About Charity fundraiser in the Last Year:   . Arboriculturist in the Last Year:   Transportation Needs:   . Film/video editor (Medical):   Marland Kitchen Lack of Transportation (Non-Medical):   Physical Activity:   . Days of Exercise per Week:   . Minutes of Exercise per Session:   Stress:   . Feeling of Stress :   Social Connections:   . Frequency of Communication with Friends and Family:   . Frequency of Social Gatherings with Friends and Family:   . Attends Religious Services:   . Active Member of Clubs or Organizations:   . Attends Archivist Meetings:   Marland Kitchen Marital Status:   Intimate Partner Violence:   . Fear of Current or Ex-Partner:   . Emotionally Abused:   Marland Kitchen Physically Abused:   . Sexually Abused:     FAMILY HISTORY: Family History  Problem Relation Age of Onset  . Diabetes Mother   . Hypertension Mother   . Diabetes Father   . Heart disease Father   . Hypertension Father   . Diabetes Sister   . Hypertension Sister   . Stroke Sister   . Diabetes Brother   . Hypertension Brother   . Cancer Paternal Grandfather   . Heart disease Maternal Grandmother   . Hypertension Maternal Grandmother   . Heart disease Maternal Grandfather     ALLERGIES:  is allergic to azithromycin, erythromycin, iodine, and shellfish allergy.  MEDICATIONS:  Current Outpatient Medications  Medication Sig Dispense Refill  . calcium carbonate (OS-CAL) 600 MG TABS tablet Take by mouth.    Marland Kitchen CALCIUM PO Take 1 tablet by mouth daily.     . cyanocobalamin (,VITAMIN B-12,) 1000 MCG/ML injection Inject 1,000 mcg into the muscle every 30 (thirty) days.     Marland Kitchen  loperamide (IMODIUM A-D) 2 MG tablet Take 4 mg by mouth once a week.     . Multiple Vitamin (MULTIVITAMIN) tablet Take 1 tablet by mouth daily.    . pantoprazole (PROTONIX) 40 MG tablet Take 1 tablet (40 mg total) by mouth 2 (two) times daily. 180 tablet 1  . chlorthalidone (HYGROTON) 25 MG tablet Take 1 tablet (25 mg total) by mouth daily. 7 tablet 0  . cholestyramine (QUESTRAN) 4 g packet Take 1 packet (4 g total) by mouth 2 (two) times daily. 60 packet 2  . HYDROcodone-acetaminophen (NORCO) 7.5-325 MG tablet Take 1-2 tablets by mouth every 6 (six) hours as needed for moderate pain. (Patient not taking: Reported on 11/27/2019) 50 tablet 0   No current facility-administered medications for this visit.   PHYSICAL EXAMINATION:  Vitals:   11/27/19 1054  Temp: (!) 97.1 F (36.2 C)   Filed Weights   11/27/19 1054  Weight: 193 lb (87.5 kg)    Physical Exam HENT:     Head: Normocephalic and atraumatic.     Mouth/Throat:     Pharynx: No oropharyngeal exudate.  Eyes:     Pupils: Pupils are equal, round, and reactive to light.  Cardiovascular:     Rate and Rhythm: Normal rate and regular rhythm.  Pulmonary:     Effort: Pulmonary effort is normal. No respiratory distress.     Breath sounds: Normal breath sounds. No wheezing.  Abdominal:     General: Bowel sounds are normal. There is no distension.     Palpations: Abdomen is soft. There is no mass.     Tenderness: There is no abdominal tenderness. There is no guarding or rebound.  Musculoskeletal:        General: No tenderness. Normal range of motion.     Cervical back: Normal range of motion and neck supple.  Skin:    General: Skin is warm.  Neurological:     Mental Status: She is alert and oriented to person, place, and time.  Psychiatric:        Mood and Affect: Affect normal.      LABORATORY DATA:  I have reviewed the data as listed Lab Results  Component Value Date   WBC 5.4 11/27/2019   HGB 12.4 11/27/2019   HCT  37.7 11/27/2019   MCV 88.7 11/27/2019   PLT 216 11/27/2019   Recent Labs    05/09/19 0945 11/27/19 1045  NA 135 140  K 3.9 3.3*  CL 103 106  CO2 26 25  GLUCOSE 92 130*  BUN 11 10  CREATININE 0.89 0.83  CALCIUM 8.6* 8.6*  GFRNONAA >60 >60  GFRAA >60 >60  PROT 6.8 7.2  ALBUMIN 4.1 4.2  AST 19 22  ALT 13 15  ALKPHOS 62 79  BILITOT 0.8 0.7    RADIOGRAPHIC STUDIES: I have personally reviewed the radiological images as listed and agreed with the findings in the report. No results found.  ASSESSMENT & PLAN:   Osteoporosis, post-menopausal # Symptomatic osteoporosis-compression fractures/traumatic fractures given risk of fall.  March 2020-BMD -2.5 as per patient continue Prolia every 6 months.  Tolerating well except for mild hypocalcemia; see below.  Again reviewed the potential side effects including dental issues. Order bone density at next visit.  # Hypocalcemia calcium 8.6-from Prolia; recommend adding vitamin D 1000 units ; continue calcium twice a day.   #History of anemia; hemoglobin is 12.2 today.-Given the fatigue check iron studies today.  #Question malabsorption-iron/B12 deficiency on B12 injections.  Stable  #History of smoking/March 2020  [[place]] lung cancer screening CT scan benign-appearing small pulmonary nodules category 2; recommend imaging at this time.  Will refer to lung cancer screening program.   # DISPOSITION: add iron studies/ ferritin # prolia today # Follow-up 6 months-MD; CBC CMP-Prolia- Dr.B  # cc; Hayely/ Dara Lords  All questions were answered. The patient knows to call the clinic with any problems, questions or concerns.    Cammie Sickle, MD 11/27/2019 3:39 PM

## 2019-11-27 NOTE — Assessment & Plan Note (Addendum)
#   Symptomatic osteoporosis-compression fractures/traumatic fractures given risk of fall.  March 2020-BMD -2.5 as per patient continue Prolia every 6 months.  Tolerating well except for mild hypocalcemia; see below.  Again reviewed the potential side effects including dental issues. Order bone density at next visit.  # Hypocalcemia calcium 8.6-from Prolia; recommend adding vitamin D 1000 units ; continue calcium twice a day.   #History of anemia; hemoglobin is 12.2 today.-Given the fatigue check iron studies today.  #Question malabsorption-iron/B12 deficiency on B12 injections.  Stable  #History of smoking/March 2020  [[place]] lung cancer screening CT scan benign-appearing small pulmonary nodules category 2; recommend imaging at this time.  Will refer to lung cancer screening program.   # DISPOSITION: add iron studies/ ferritin # prolia today # Follow-up 6 months-MD; CBC CMP-Prolia- Dr.B  # cc; Hayely/ Dara Lords

## 2019-11-28 ENCOUNTER — Telehealth: Payer: Self-pay | Admitting: *Deleted

## 2019-11-28 NOTE — Telephone Encounter (Signed)
Received referral for low dose lung cancer screening CT scan. Message left at phone number listed in EMR for patient to call me back to facilitate scheduling scan.  

## 2019-12-01 ENCOUNTER — Other Ambulatory Visit: Payer: Self-pay | Admitting: Internal Medicine

## 2019-12-01 ENCOUNTER — Telehealth: Payer: Self-pay | Admitting: Internal Medicine

## 2019-12-01 NOTE — Telephone Encounter (Signed)
On 6/14-spoke to patient regarding results of the low iron levels; patient symptomatic fatigue.  History of malabsorption/gastric bypass.  History of previous iron infusions.  Schedule-IV Venofer weekly x 2 infusions- start next week.   # also please schedule August appointment to mid December 2021; MD labs; Prolia possible Venofer-dr.B

## 2019-12-02 ENCOUNTER — Telehealth: Payer: Self-pay | Admitting: *Deleted

## 2019-12-02 ENCOUNTER — Encounter: Payer: Self-pay | Admitting: *Deleted

## 2019-12-02 DIAGNOSIS — Z122 Encounter for screening for malignant neoplasm of respiratory organs: Secondary | ICD-10-CM

## 2019-12-02 DIAGNOSIS — Z87891 Personal history of nicotine dependence: Secondary | ICD-10-CM

## 2019-12-02 NOTE — Telephone Encounter (Signed)
Received referral for initial lung cancer screening scan. Contacted patient and obtained smoking history,(former, quit 2009, 92.5 pack year) as well as answering questions related to screening process. Patient denies signs of lung cancer such as weight loss or hemoptysis. Patient denies comorbidity that would prevent curative treatment if lung cancer were found. Patient is scheduled for shared decision making visit and CT scan on 12/24/19 at 1015am.

## 2019-12-09 ENCOUNTER — Other Ambulatory Visit: Payer: Self-pay

## 2019-12-09 ENCOUNTER — Inpatient Hospital Stay: Payer: Medicare Other

## 2019-12-09 VITALS — BP 155/83 | HR 72 | Temp 96.6°F | Resp 18

## 2019-12-09 DIAGNOSIS — M810 Age-related osteoporosis without current pathological fracture: Secondary | ICD-10-CM

## 2019-12-09 DIAGNOSIS — Z9884 Bariatric surgery status: Secondary | ICD-10-CM | POA: Diagnosis not present

## 2019-12-09 DIAGNOSIS — D509 Iron deficiency anemia, unspecified: Secondary | ICD-10-CM | POA: Diagnosis not present

## 2019-12-09 DIAGNOSIS — M818 Other osteoporosis without current pathological fracture: Secondary | ICD-10-CM | POA: Diagnosis not present

## 2019-12-09 MED ORDER — SODIUM CHLORIDE 0.9 % IV SOLN
Freq: Once | INTRAVENOUS | Status: AC
  Filled 2019-12-09: qty 250

## 2019-12-09 MED ORDER — SODIUM CHLORIDE 0.9 % IV SOLN: 200.0000 mg | Freq: Once | INTRAVENOUS | Status: DC

## 2019-12-09 MED ORDER — IRON SUCROSE 20 MG/ML IV SOLN
200.0000 mg | Freq: Once | INTRAVENOUS | Status: AC
  Administered 2019-12-09: 200 mg via INTRAVENOUS
  Filled 2019-12-09: qty 10

## 2019-12-16 ENCOUNTER — Inpatient Hospital Stay: Payer: Medicare Other

## 2019-12-16 ENCOUNTER — Other Ambulatory Visit: Payer: Self-pay

## 2019-12-16 VITALS — BP 146/82 | HR 75 | Temp 97.0°F | Resp 18

## 2019-12-16 DIAGNOSIS — M818 Other osteoporosis without current pathological fracture: Secondary | ICD-10-CM | POA: Diagnosis not present

## 2019-12-16 DIAGNOSIS — D509 Iron deficiency anemia, unspecified: Secondary | ICD-10-CM | POA: Diagnosis not present

## 2019-12-16 DIAGNOSIS — M810 Age-related osteoporosis without current pathological fracture: Secondary | ICD-10-CM

## 2019-12-16 DIAGNOSIS — Z9884 Bariatric surgery status: Secondary | ICD-10-CM | POA: Diagnosis not present

## 2019-12-16 MED ORDER — SODIUM CHLORIDE 0.9 % IV SOLN: 200.0000 mg | Freq: Once | INTRAVENOUS | Status: DC

## 2019-12-16 MED ORDER — IRON SUCROSE 20 MG/ML IV SOLN
200.0000 mg | Freq: Once | INTRAVENOUS | Status: AC
  Administered 2019-12-16: 200 mg via INTRAVENOUS
  Filled 2019-12-16: qty 10

## 2019-12-16 MED ORDER — SODIUM CHLORIDE 0.9 % IV SOLN
Freq: Once | INTRAVENOUS | Status: AC
  Filled 2019-12-16: qty 250

## 2019-12-24 ENCOUNTER — Inpatient Hospital Stay: Payer: Medicare Other | Attending: Oncology | Admitting: Oncology

## 2019-12-24 ENCOUNTER — Ambulatory Visit
Admission: RE | Admit: 2019-12-24 | Discharge: 2019-12-24 | Disposition: A | Discharge status: Home / Self Care | Payer: Medicare Other | Source: Ambulatory Visit | Attending: Oncology | Admitting: Oncology

## 2019-12-24 ENCOUNTER — Other Ambulatory Visit: Payer: Self-pay

## 2019-12-24 ENCOUNTER — Other Ambulatory Visit: Payer: Self-pay | Admitting: Oncology

## 2019-12-24 ENCOUNTER — Telehealth: Payer: Self-pay | Admitting: *Deleted

## 2019-12-24 ENCOUNTER — Encounter: Payer: Self-pay | Admitting: Oncology

## 2019-12-24 DIAGNOSIS — Z122 Encounter for screening for malignant neoplasm of respiratory organs: Secondary | ICD-10-CM | POA: Diagnosis not present

## 2019-12-24 DIAGNOSIS — Z87891 Personal history of nicotine dependence: Secondary | ICD-10-CM | POA: Diagnosis not present

## 2019-12-24 DIAGNOSIS — R918 Other nonspecific abnormal finding of lung field: Secondary | ICD-10-CM

## 2019-12-24 NOTE — Telephone Encounter (Signed)
Notified patient of LDCT lung cancer screening program results with recommendation for evaluation of abnormal finding now. Also notified of incidental findings noted below and is encouraged to discuss further with PCP who will receive a copy of this note and/or the CT report. Plan for PET scan with multidisciplinary follow up afterward. Patient verbalizes understanding.   IMPRESSION: 1. Lung-RADS 4B, suspicious. Additional imaging evaluation with PET-CT or consultation with Pulmonology or Thoracic Surgery recommended. 2. Aortic Atherosclerosis (ICD10-I70.0) and Emphysema (ICD10-J43.9). 3. These results will be called to the ordering clinician or representative by the Radiologist Assistant, and communication documented in the PACS or [employer].

## 2019-12-24 NOTE — Progress Notes (Signed)
Bridgett Larsson, NP 12/24/2019 1:50 PM

## 2019-12-24 NOTE — Progress Notes (Signed)
Re: abnormal lung screening  PET scan ordered. Shawn/Hayley to get scheduled and touch base with patient.   IMPRESSION: 1. Lung-RADS 4B, suspicious. Additional imaging evaluation with PET-CT or consultation with Pulmonology or Thoracic Surgery recommended. 2. Aortic Atherosclerosis (ICD10-I70.0) and Emphysema (ICD10-J43.9). 3. These results will be called to the ordering clinician or representative by the Radiologist Assistant, and communication documented in the PACS or [employer].  Faythe Casa, NP 12/24/2019 2:18 PM

## 2019-12-24 NOTE — Progress Notes (Signed)
Virtual Visit via Video Note  I connected with Brittney Stephens on 12/24/19 at 10:15 AM EDT by a video enabled telemedicine application and verified that I am speaking with the correct person using two identifiers.  Location: Patient:[facility] Provider: Clinic    I discussed the limitations of evaluation and management by telemedicine and the availability of in person appointments. The patient expressed understanding and agreed to proceed.  I discussed the assessment and treatment plan with the patient. The patient was provided an opportunity to ask questions and all were answered. The patient agreed with the plan and demonstrated an understanding of the instructions.   The patient was advised to call back or seek an in-person evaluation if the symptoms worsen or if the condition fails to improve as anticipated.   In accordance with CMS guidelines, patient has met eligibility criteria including age, absence of signs or symptoms of lung cancer.  Social History   Tobacco Use  . Smoking status: Former Smoker    Packs/day: 2.50    Years: 37.00    Pack years: 92.50    Types: Cigarettes    Quit date: 2009    Years since quitting: 12.5  . Smokeless tobacco: Never Used  . Tobacco comment: quit 10 years ago   Substance Use Topics  . Alcohol use: Yes    Alcohol/week: 9.0 standard drinks    Types: 7 Glasses of wine, 2 Standard drinks or equivalent per week  . Drug use: No      A shared decision-making session was conducted prior to the performance of CT scan. This includes one or more decision aids, includes benefits and harms of screening, follow-up diagnostic testing, over-diagnosis, false positive rate, and total radiation exposure.   Counseling on the importance of adherence to annual lung cancer LDCT screening, impact of co-morbidities, and ability or willingness to undergo diagnosis and treatment is imperative for compliance of the program.   Counseling on the importance of continued smoking  cessation for former smokers; the importance of smoking cessation for current smokers, and information about tobacco cessation interventions have been given to patient including [place] and [facility] quit [place] programs.   Written order for lung cancer screening with LDCT has been given to the patient and any and all questions have been answered to the best of my abilities.    Yearly follow up will be coordinated by Burgess Estelle, Thoracic Navigator.  I provided 15 minutes of face-to-face video visit time during this encounter, and > 50% was spent counseling as documented under my assessment & plan.   Jacquelin Hawking, NP

## 2019-12-26 ENCOUNTER — Encounter
Admission: RE | Admit: 2019-12-26 | Discharge: 2019-12-26 | Disposition: A | Discharge status: Home / Self Care | Payer: Medicare Other | Source: Ambulatory Visit | Attending: Oncology | Admitting: Oncology

## 2019-12-26 ENCOUNTER — Other Ambulatory Visit: Payer: Self-pay

## 2019-12-26 DIAGNOSIS — R918 Other nonspecific abnormal finding of lung field: Secondary | ICD-10-CM | POA: Diagnosis not present

## 2019-12-26 DIAGNOSIS — Z9884 Bariatric surgery status: Secondary | ICD-10-CM | POA: Diagnosis not present

## 2019-12-26 DIAGNOSIS — R911 Solitary pulmonary nodule: Secondary | ICD-10-CM | POA: Diagnosis not present

## 2019-12-26 DIAGNOSIS — C3490 Malignant neoplasm of unspecified part of unspecified bronchus or lung: Secondary | ICD-10-CM | POA: Diagnosis not present

## 2019-12-26 LAB — GLUCOSE, CAPILLARY: Glucose-Capillary: 97 mg/dL (ref 70–99)

## 2019-12-26 MED ORDER — FLUDEOXYGLUCOSE F - 18 (FDG) INJECTION
11.0600 | Freq: Once | INTRAVENOUS | Status: AC | PRN
  Administered 2019-12-26: 11.06 via INTRAVENOUS

## 2019-12-29 ENCOUNTER — Telehealth: Payer: Self-pay | Admitting: *Deleted

## 2019-12-29 NOTE — Telephone Encounter (Signed)
Reviewed PET results with patient. Verbalizes understanding and is anticipating antibiotic treatment and follow up scan.   IMPRESSION: 1. Interval expansion of the LEFT lower lobe lesion is most consistent with pulmonary infection. Recommend appropriate antibiotic treatment and follow-up CT in 6-8 weeks. 2. No evidence of metastatic adenopathy or distant metastatic disease.

## 2019-12-30 ENCOUNTER — Telehealth (HOSPITAL_BASED_OUTPATIENT_CLINIC_OR_DEPARTMENT_OTHER): Payer: Medicare Other | Admitting: Hospice and Palliative Medicine

## 2019-12-30 DIAGNOSIS — J189 Pneumonia, unspecified organism: Secondary | ICD-10-CM | POA: Diagnosis not present

## 2019-12-30 DIAGNOSIS — R918 Other nonspecific abnormal finding of lung field: Secondary | ICD-10-CM | POA: Diagnosis not present

## 2019-12-30 MED ORDER — LEVOFLOXACIN 500 MG PO TABS
500.0000 mg | ORAL_TABLET | Freq: Every day | ORAL | 0 refills | Status: AC
Start: 2019-12-30 — End: ?

## 2019-12-30 NOTE — Progress Notes (Signed)
Virtual Visit via Telephone Note  I connected with Brittney Stephens on 12/30/19 at  9:15 AM EDT by telephone and verified that I am speaking with the correct person using two identifiers.   I discussed the limitations, risks, security and privacy concerns of performing an evaluation and management service by telephone and the availability of in person appointments. I also discussed with the patient that there may be a patient responsible charge related to this service. The patient expressed understanding and agreed to proceed.   History of Present Illness: [name] is a 64 year old woman with history of iron/B12 malabsorption, postmenopausal osteoporosis, white matter disease, and Barrett's esophagus, and history of tobacco use.  Patient underwent low-dose CT screening of the lung on 12/24/2019 and was found to have centrilobular emphysema with a RADS 4B with scattered pulmonary nodules and a right lower lobe opacity.  Subsequent PET scan on 12/26/2019 revealed a rapid interval expansion of the left lower lobe lesion most consistent with pulmonary infection.  Antibiotics and follow-up CT in 6 to 8 weeks was recommended.  There was no evidence of metastatic adenopathy or distant metastatic disease.   Observations/Objective: I called and reviewed the results of the PET scan with patient.  She says that since she had her CT scan last week, she has developed a nonproductive cough.  She denies fever or chills.  She has had one episode of shortness of breath with strenuous ambulation but denies wheezing.  No other symptomatic complaints, changes, or concerns.  I reviewed patient's allergy list with her.  Patient reports good tolerance in the past to fluoroquinolones.  Patient is currently vacationing in [place], [place].  Patient's PCP is currently Dr. Ronnald Ramp but she plans to reestablish care with Dr. Ouida Sills after she returns home. She reports having an appointment with Dr. Ouida Sills on  01/29/20.  Assessment and Plan: Community-acquired pneumonia -given history of previous antibiotic allergies and risk associated with age/emphysema, will start monotherapy with Levaquin 500mg  PO daily x 10 days. Will need repeat CT chest in 6-8 weeks.   Case and plan discussed with Dr. Rogue Bussing  Follow Up Instructions: Follow up with [facility], Rulon Abide, NP, when patient returns to [facility]   I discussed the assessment and treatment plan with the patient. The patient was provided an opportunity to ask questions and all were answered. The patient agreed with the plan and demonstrated an understanding of the instructions.   The patient was advised to call back or seek an in-person evaluation if the symptoms worsen or if the condition fails to improve as anticipated.  I provided 10 minutes of non-face-to-face time during this encounter.   Irean Hong, NP

## 2020-01-08 ENCOUNTER — Other Ambulatory Visit: Payer: Medicare Other

## 2020-01-08 ENCOUNTER — Ambulatory Visit: Payer: Medicare Other | Admitting: Internal Medicine

## 2020-01-08 ENCOUNTER — Ambulatory Visit: Payer: Medicare Other

## 2020-01-11 ENCOUNTER — Encounter: Payer: Self-pay | Admitting: Oncology

## 2020-01-13 ENCOUNTER — Other Ambulatory Visit: Payer: Self-pay | Admitting: Oncology

## 2020-01-13 DIAGNOSIS — R918 Other nonspecific abnormal finding of lung field: Secondary | ICD-10-CM

## 2020-01-13 NOTE — Progress Notes (Signed)
Re: Follow-up  Patient was seen after her PET scan by Altha Harm, NP to review and offer recommendations.  PET scan showed rapid interval expansion of the left lower lobe lesion most consistent with pulmonary infection.  She was given a prescription for Levaquin 500 mg p.o. x10 days.  Repeat CT chest in 6 to 8 weeks.  Orders placed today.  Faythe Casa, NP 01/13/2020 9:30 AM

## 2020-01-22 ENCOUNTER — Ambulatory Visit: Payer: Medicare Other | Admitting: Internal Medicine

## 2020-01-22 ENCOUNTER — Ambulatory Visit: Payer: Medicare Other

## 2020-01-22 ENCOUNTER — Other Ambulatory Visit: Payer: Medicare Other

## 2020-01-22 DIAGNOSIS — J189 Pneumonia, unspecified organism: Secondary | ICD-10-CM | POA: Diagnosis not present

## 2020-01-29 DIAGNOSIS — Z0000 Encounter for general adult medical examination without abnormal findings: Secondary | ICD-10-CM | POA: Diagnosis not present

## 2020-01-29 DIAGNOSIS — K219 Gastro-esophageal reflux disease without esophagitis: Secondary | ICD-10-CM | POA: Diagnosis not present

## 2020-01-29 DIAGNOSIS — R05 Cough: Secondary | ICD-10-CM | POA: Diagnosis not present

## 2020-01-29 DIAGNOSIS — I2510 Atherosclerotic heart disease of native coronary artery without angina pectoris: Secondary | ICD-10-CM | POA: Diagnosis not present

## 2020-01-29 DIAGNOSIS — D510 Vitamin B12 deficiency anemia due to intrinsic factor deficiency: Secondary | ICD-10-CM | POA: Diagnosis not present

## 2020-01-30 DIAGNOSIS — K219 Gastro-esophageal reflux disease without esophagitis: Secondary | ICD-10-CM | POA: Diagnosis not present

## 2020-01-30 DIAGNOSIS — D510 Vitamin B12 deficiency anemia due to intrinsic factor deficiency: Secondary | ICD-10-CM | POA: Diagnosis not present

## 2020-01-30 DIAGNOSIS — I2510 Atherosclerotic heart disease of native coronary artery without angina pectoris: Secondary | ICD-10-CM | POA: Diagnosis not present

## 2020-02-13 ENCOUNTER — Other Ambulatory Visit: Payer: Self-pay

## 2020-02-13 ENCOUNTER — Ambulatory Visit
Admission: RE | Admit: 2020-02-13 | Discharge: 2020-02-13 | Disposition: A | Discharge status: Home / Self Care | Payer: Medicare Other | Source: Ambulatory Visit | Attending: Oncology | Admitting: Oncology

## 2020-02-13 ENCOUNTER — Other Ambulatory Visit: Payer: Self-pay | Admitting: Oncology

## 2020-02-13 DIAGNOSIS — I2510 Atherosclerotic heart disease of native coronary artery without angina pectoris: Secondary | ICD-10-CM | POA: Insufficient documentation

## 2020-02-13 DIAGNOSIS — I700 Atherosclerosis of aorta: Secondary | ICD-10-CM | POA: Diagnosis not present

## 2020-02-13 DIAGNOSIS — R918 Other nonspecific abnormal finding of lung field: Secondary | ICD-10-CM

## 2020-02-13 DIAGNOSIS — J432 Centrilobular emphysema: Secondary | ICD-10-CM | POA: Insufficient documentation

## 2020-02-13 DIAGNOSIS — Z122 Encounter for screening for malignant neoplasm of respiratory organs: Secondary | ICD-10-CM | POA: Insufficient documentation

## 2020-02-13 DIAGNOSIS — J40 Bronchitis, not specified as acute or chronic: Secondary | ICD-10-CM | POA: Diagnosis not present

## 2020-02-13 DIAGNOSIS — Z87891 Personal history of nicotine dependence: Secondary | ICD-10-CM | POA: Insufficient documentation

## 2020-02-14 ENCOUNTER — Encounter: Payer: Self-pay | Admitting: *Deleted

## 2020-02-19 DIAGNOSIS — I700 Atherosclerosis of aorta: Secondary | ICD-10-CM | POA: Diagnosis not present

## 2020-02-19 DIAGNOSIS — I2510 Atherosclerotic heart disease of native coronary artery without angina pectoris: Secondary | ICD-10-CM | POA: Diagnosis not present

## 2020-05-19 DIAGNOSIS — M5489 Other dorsalgia: Secondary | ICD-10-CM | POA: Diagnosis not present

## 2020-05-26 DIAGNOSIS — M62830 Muscle spasm of back: Secondary | ICD-10-CM | POA: Diagnosis not present

## 2020-05-26 DIAGNOSIS — M9901 Segmental and somatic dysfunction of cervical region: Secondary | ICD-10-CM | POA: Diagnosis not present

## 2020-05-26 DIAGNOSIS — M50223 Other cervical disc displacement at C6-C7 level: Secondary | ICD-10-CM | POA: Diagnosis not present

## 2020-05-26 DIAGNOSIS — M531 Cervicobrachial syndrome: Secondary | ICD-10-CM | POA: Diagnosis not present

## 2020-05-28 DIAGNOSIS — M62830 Muscle spasm of back: Secondary | ICD-10-CM | POA: Diagnosis not present

## 2020-05-28 DIAGNOSIS — M50223 Other cervical disc displacement at C6-C7 level: Secondary | ICD-10-CM | POA: Diagnosis not present

## 2020-05-28 DIAGNOSIS — M9901 Segmental and somatic dysfunction of cervical region: Secondary | ICD-10-CM | POA: Diagnosis not present

## 2020-05-28 DIAGNOSIS — M531 Cervicobrachial syndrome: Secondary | ICD-10-CM | POA: Diagnosis not present

## 2020-05-31 DIAGNOSIS — R197 Diarrhea, unspecified: Secondary | ICD-10-CM | POA: Diagnosis not present

## 2020-06-01 ENCOUNTER — Inpatient Hospital Stay: Payer: Medicare Other

## 2020-06-01 ENCOUNTER — Inpatient Hospital Stay: Payer: Medicare Other | Attending: Internal Medicine

## 2020-06-01 ENCOUNTER — Inpatient Hospital Stay: Payer: Medicare Other | Admitting: Internal Medicine

## 2020-06-01 ENCOUNTER — Other Ambulatory Visit: Payer: Self-pay | Admitting: *Deleted

## 2020-06-01 DIAGNOSIS — M810 Age-related osteoporosis without current pathological fracture: Secondary | ICD-10-CM

## 2020-06-01 DIAGNOSIS — Z122 Encounter for screening for malignant neoplasm of respiratory organs: Secondary | ICD-10-CM

## 2020-06-01 DIAGNOSIS — D510 Vitamin B12 deficiency anemia due to intrinsic factor deficiency: Secondary | ICD-10-CM

## 2020-06-07 DIAGNOSIS — H8309 Labyrinthitis, unspecified ear: Secondary | ICD-10-CM | POA: Diagnosis not present

## 2020-06-16 DIAGNOSIS — I700 Atherosclerosis of aorta: Secondary | ICD-10-CM | POA: Diagnosis not present

## 2020-06-16 DIAGNOSIS — I2510 Atherosclerotic heart disease of native coronary artery without angina pectoris: Secondary | ICD-10-CM | POA: Diagnosis not present

## 2021-02-08 ENCOUNTER — Other Ambulatory Visit: Payer: Self-pay | Admitting: Orthopedic Surgery

## 2021-02-08 DIAGNOSIS — M5416 Radiculopathy, lumbar region: Secondary | ICD-10-CM

## 2021-02-14 ENCOUNTER — Ambulatory Visit
Admission: RE | Admit: 2021-02-14 | Discharge: 2021-02-14 | Disposition: A | Discharge status: Home / Self Care | Payer: Medicare Other | Source: Ambulatory Visit | Attending: Orthopedic Surgery | Admitting: Orthopedic Surgery

## 2021-02-14 ENCOUNTER — Other Ambulatory Visit: Payer: Self-pay

## 2021-02-14 DIAGNOSIS — M5416 Radiculopathy, lumbar region: Secondary | ICD-10-CM | POA: Insufficient documentation

## 2021-03-07 ENCOUNTER — Other Ambulatory Visit: Payer: Self-pay | Admitting: Internal Medicine

## 2021-03-07 DIAGNOSIS — Z1231 Encounter for screening mammogram for malignant neoplasm of breast: Secondary | ICD-10-CM

## 2021-03-09 ENCOUNTER — Other Ambulatory Visit: Payer: Self-pay | Admitting: Internal Medicine

## 2021-03-09 DIAGNOSIS — Z1231 Encounter for screening mammogram for malignant neoplasm of breast: Secondary | ICD-10-CM

## 2021-05-21 IMAGING — PT NM PET TUM IMG INITIAL (PI) SKULL BASE T - THIGH
9 series · 25 of 25 positions shown · non-contrast
Comparison: Chest CT 12/24/2019

CLINICAL DATA: Initial treatment strategy for pulmonary nodule.
Abnormal CT on lung screening exam. COVID vaccine LEFT arm.

EXAM:
NUCLEAR MEDICINE PET SKULL BASE TO THIGH
TECHNIQUE: 11.1 mCi F-18 FDG was injected intravenously. Full-ring PET imaging
was performed from the skull base to thigh after the radiotracer. CT
data was obtained and used for attenuation correction and anatomic
localization.
Fasting blood glucose: 97 mg/dl

[Series 3: ct wb 5.0 b30f · axial · 5.0mm · 0.98mm/px · z∈[-980,-113]mm · 3 of 290 slices shown]
[im 1/290]
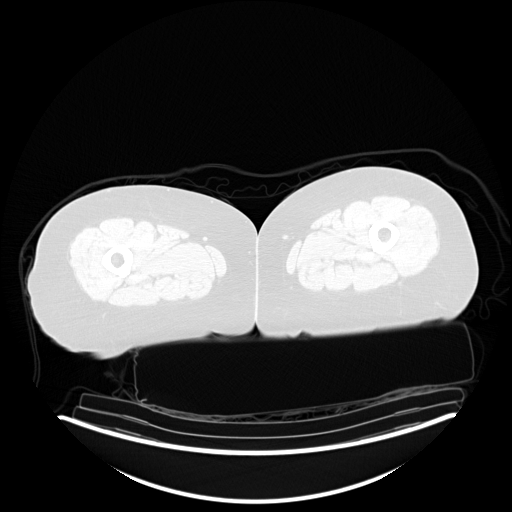
[im 145/290]
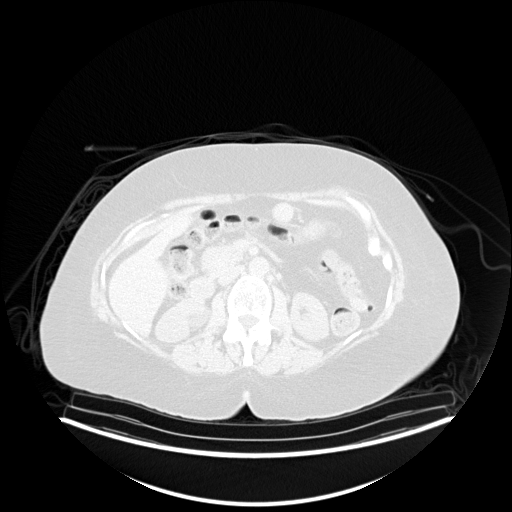
[im 290/290  brain]
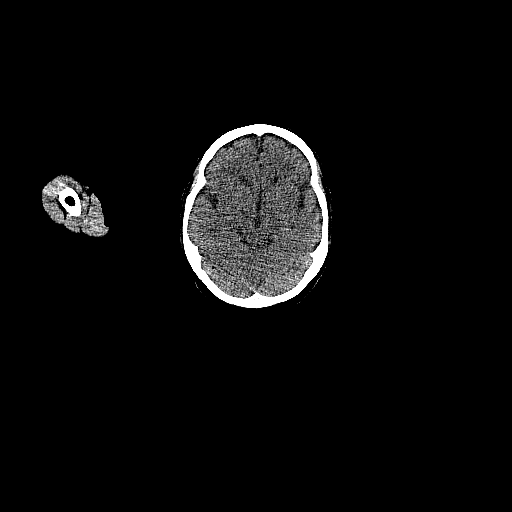

[Series 5: pet wb uncorrected (nac) · axial · 5.0mm · 4.07mm/px · z∈[-980,-113]mm · 4 of 290 slices shown]
[im 1/290]
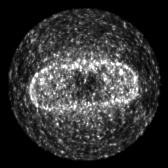
[im 97/290]
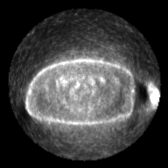
[im 193/290]
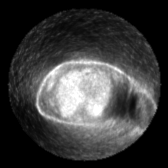
[im 290/290]
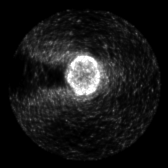

[Series 6: pet wb (ac) · axial · 5.0mm · 2.72mm/px · z∈[-980,-113]mm · 4 of 290 slices shown]
[im 1/290]
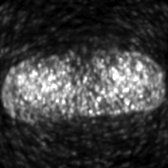
[im 97/290]
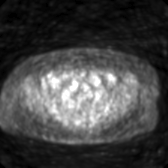
[im 193/290]
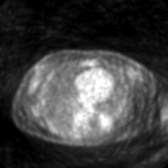
[im 290/290]
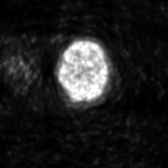

[Series 603: fused axial · 4 of 290 slices shown]
[im 1/290]
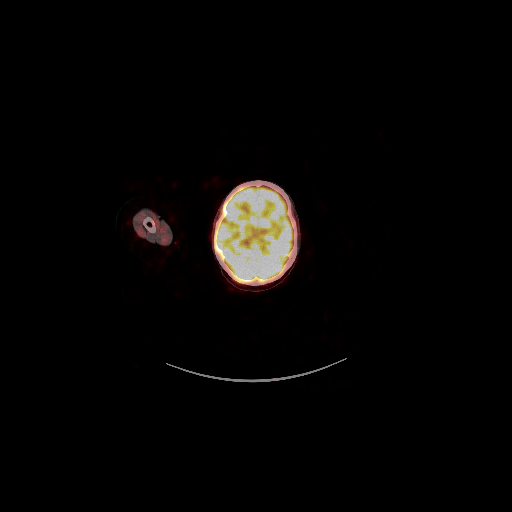
[im 97/290]
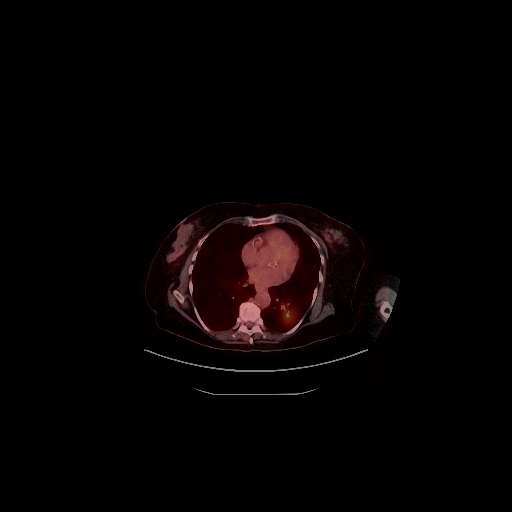
[im 193/290]
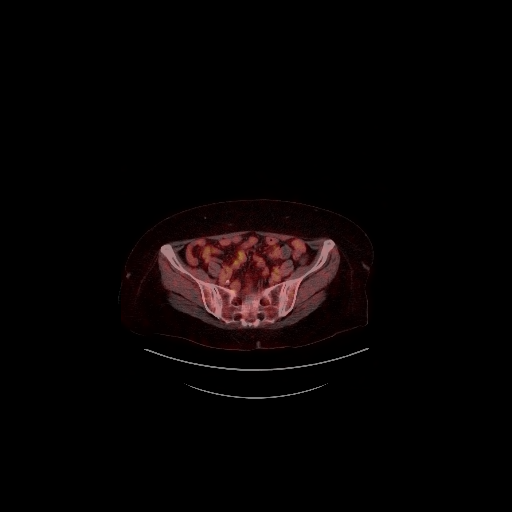
[im 290/290]
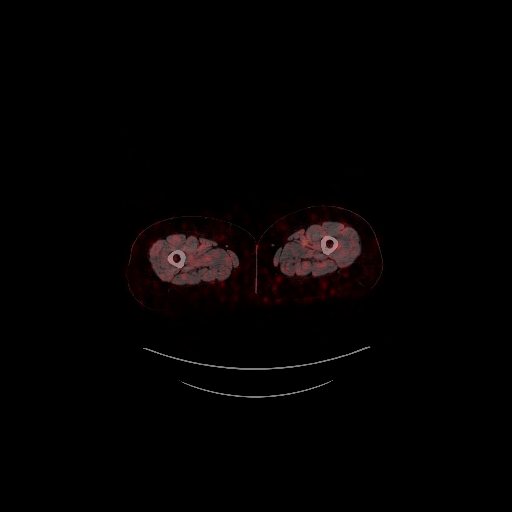

[Series 604: fused coronal · 1 of 93 slices shown]
[im 1/93]
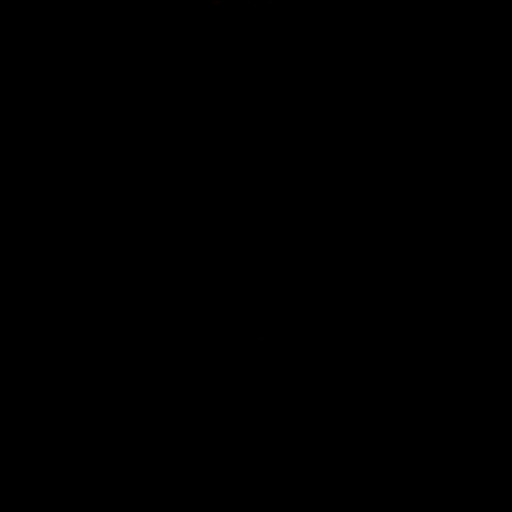

[Series 605: fused sagittal · 2 of 154 slices shown]
[im 1/154]
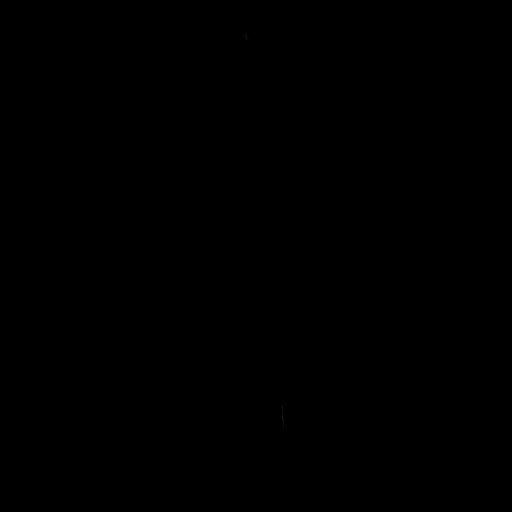
[im 154/154]
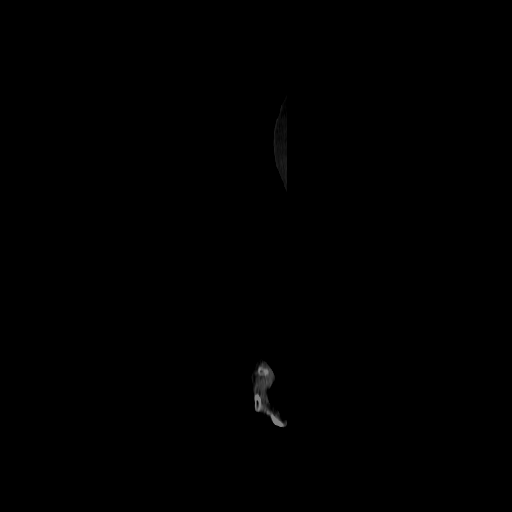

[Series 606: pet axial · 4 of 288 slices shown]
[im 1/288]
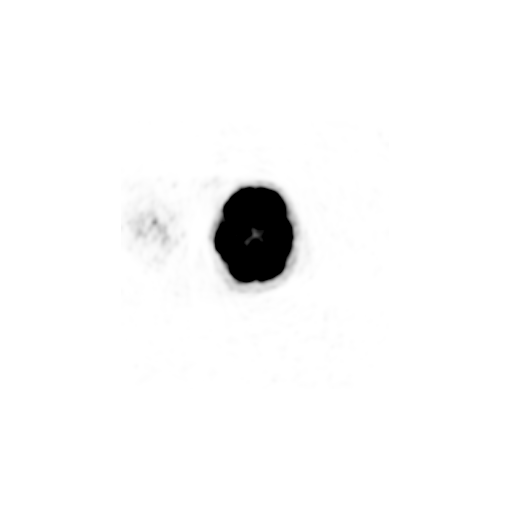
[im 96/288]
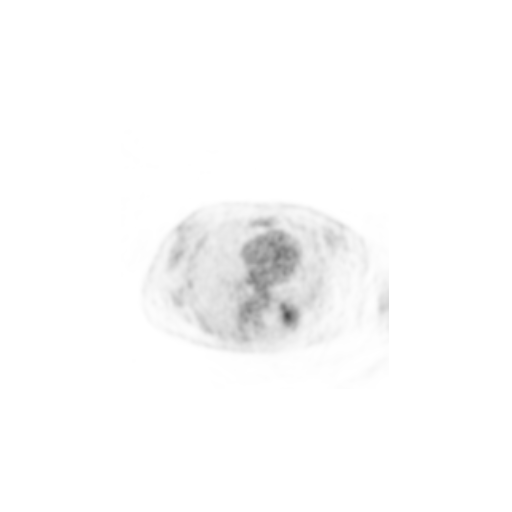
[im 192/288]
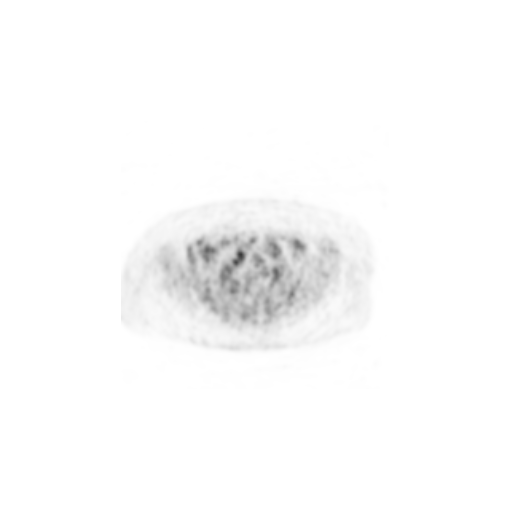
[im 288/288]
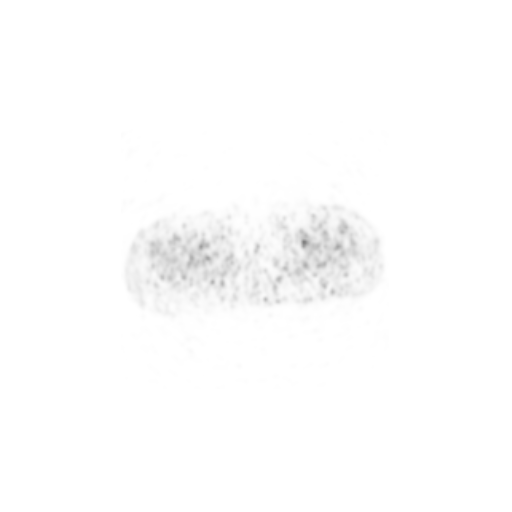

[Series 607: pet coronal · 1 of 103 slices shown]
[im 1/103]
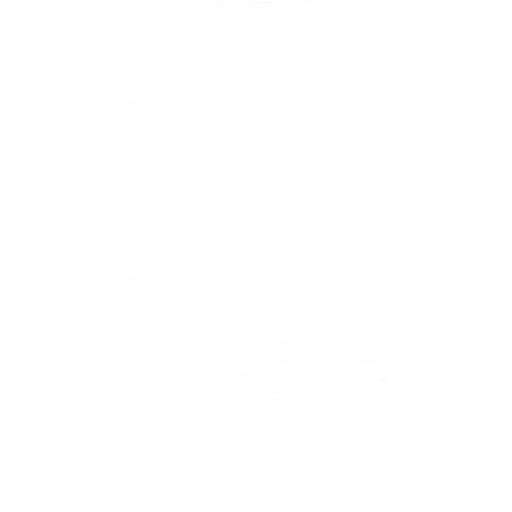

[Series 608: pet sagittal · 2 of 152 slices shown]
[im 1/152]
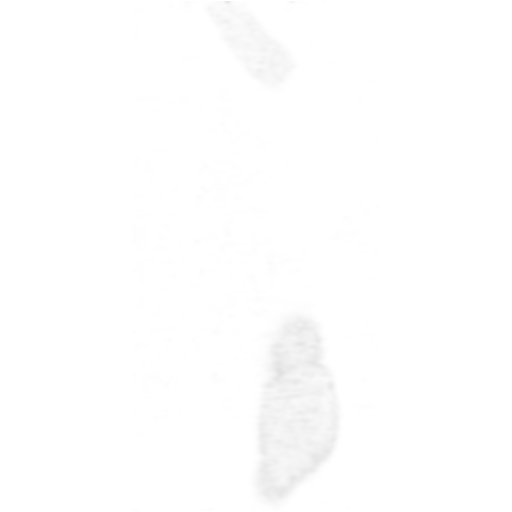
[im 152/152]
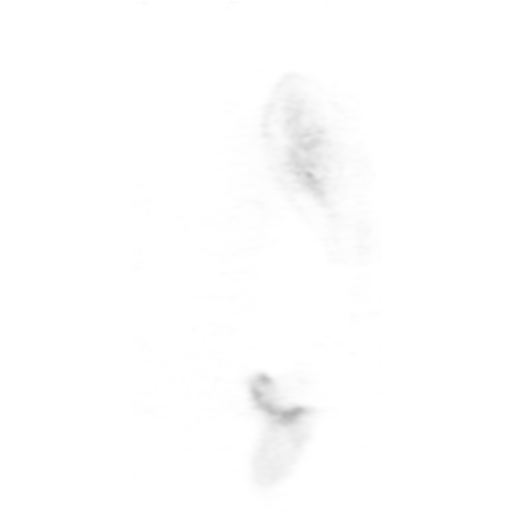

[25 of 25 positions shown; findings below may reference images not displayed]

FINDINGS: Mediastinal blood pool activity: SUV max

Liver activity: SUV max NA

NECK: No hypermetabolic lymph nodes in the neck.

Incidental CT findings: none

CHEST: Segmental ovoid lesion in the LEFT lower lobe measures 3.7 x
2.2 cm which is increased from 2.5 by 1.2 cm on CT exam 2 days
prior. This rapid increase in size is more suggestive of an
infectious process than a malignant process. Lesion is intensely
hypermetabolic with SUV max equal 8.3.

Note both malignancies and acute pulmonary infection can be
intensely hypermetabolic.

No hypermetabolic mediastinal lymph nodes.

Incidental CT findings: none

ABDOMEN/PELVIS: No abnormal hypermetabolic activity within the
liver, pancreas, adrenal glands, or spleen. No hypermetabolic lymph
nodes in the abdomen or pelvis.

Incidental CT findings: Post bariatric surgery

SKELETON: No focal hypermetabolic activity to suggest skeletal
metastasis.

Incidental CT findings: none
IMPRESSION: 1. Interval expansion of the LEFT lower lobe lesion is most
consistent with pulmonary infection. Recommend appropriate
antibiotic treatment and follow-up CT in 6-8 weeks.
2. No evidence of metastatic adenopathy or distant metastatic
disease.

## 2021-07-09 IMAGING — CT CT CHEST LCS NODULE FOLLOW-UP W/O CM
2 of 5 series · 14 of 40 positions shown, 17 images · non-contrast
Comparison: Low-dose lung cancer screening chest CT 12/24/2019.

CLINICAL DATA: 63-year-old female former smoker (quit in 1110) with
93 pack-year history of smoking. Follow-up for prior abnormal
low-dose lung cancer screening examination.

EXAM:
CT CHEST WITHOUT CONTRAST FOR LUNG CANCER SCREENING NODULE FOLLOW-UP
TECHNIQUE: Multidetector CT imaging of the chest was performed following the
standard protocol without IV contrast.

[Series 3: lung lcs f/u 1.00 · axial · 0.57mm/px · z∈[-1188,-914]mm · 11 of 302 slices shown, 14 images]
[im 14/302  mediastinal]
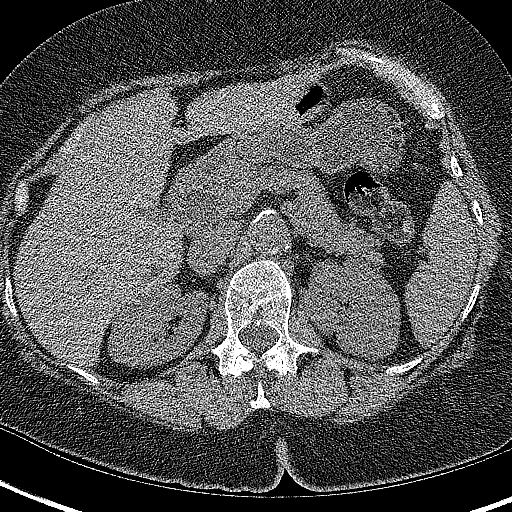
[im 14/302  lung]
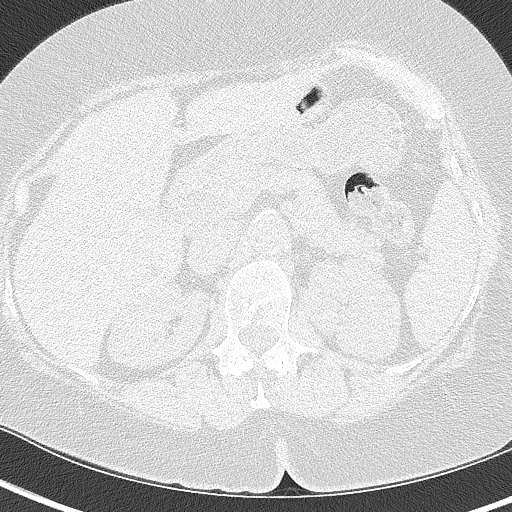
[im 42/302  lung]
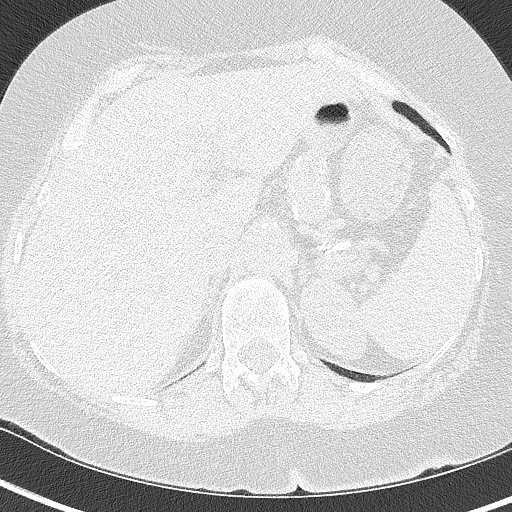
[im 69/302  lung]
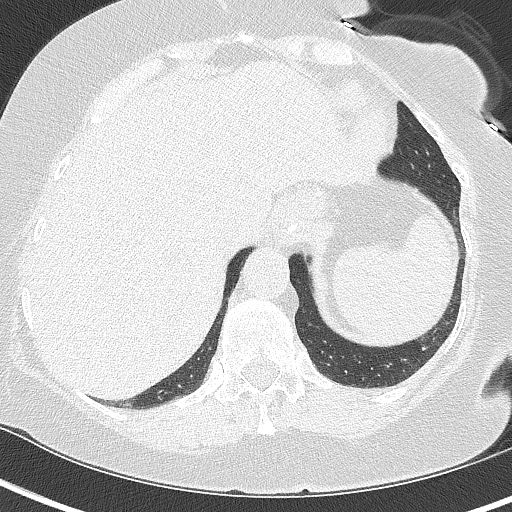
[im 96/302  lung]
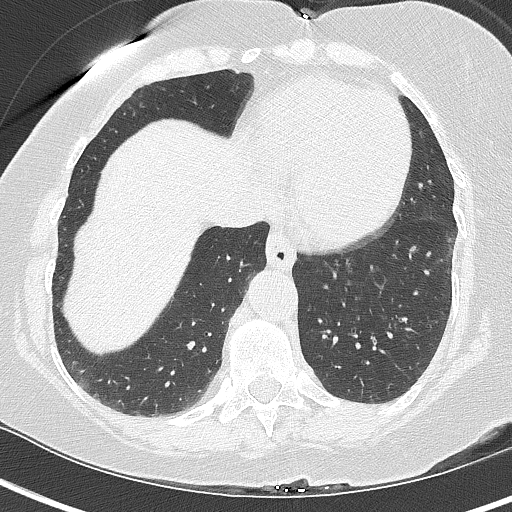
[im 124/302  mediastinal]
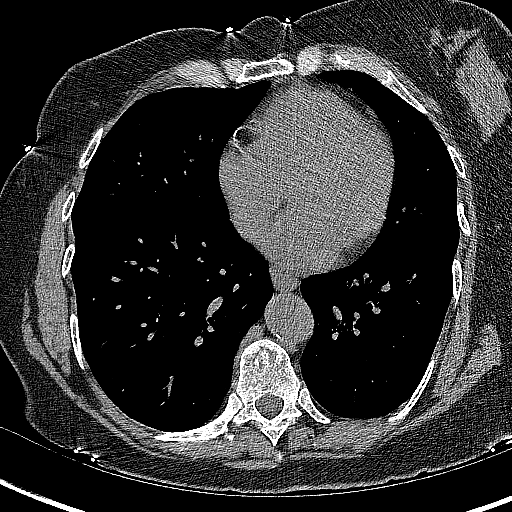
[im 124/302  lung]
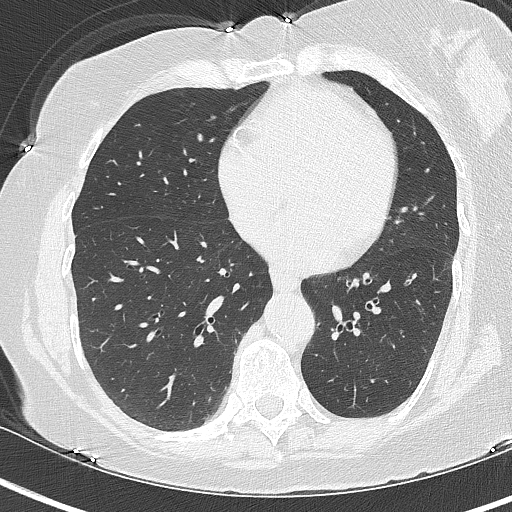
[im 151/302  lung]
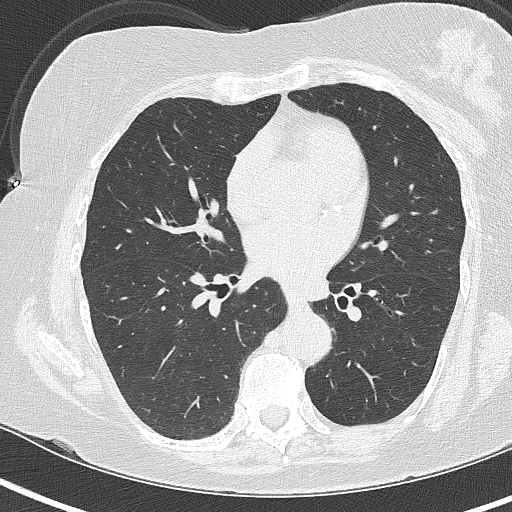
[im 178/302  lung]
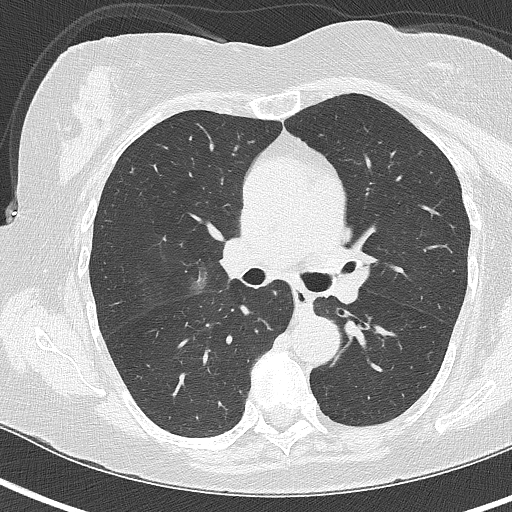
[im 206/302  lung]
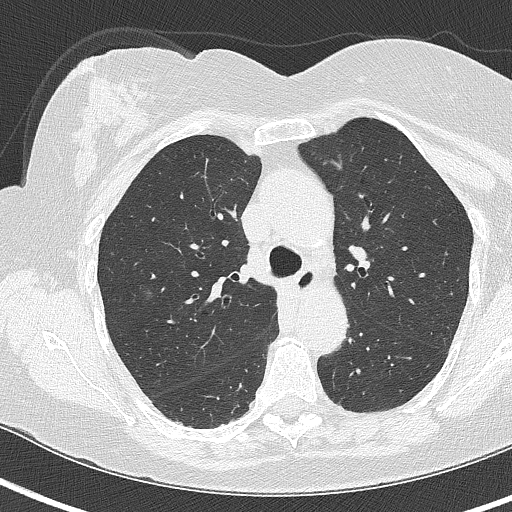
[im 233/302  mediastinal]
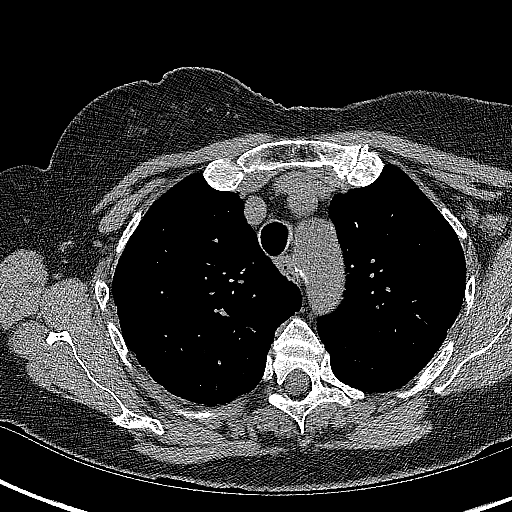
[im 233/302  lung]
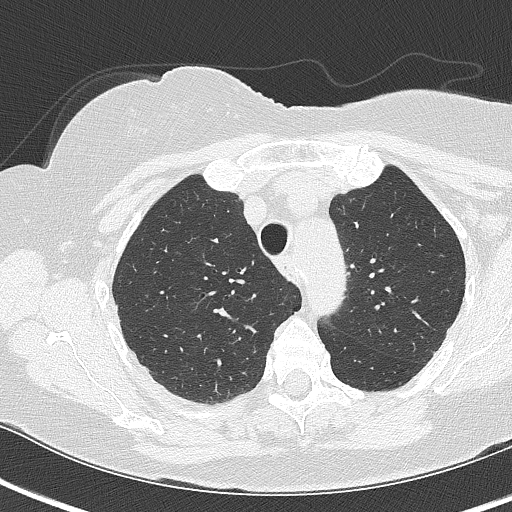
[im 260/302  lung]
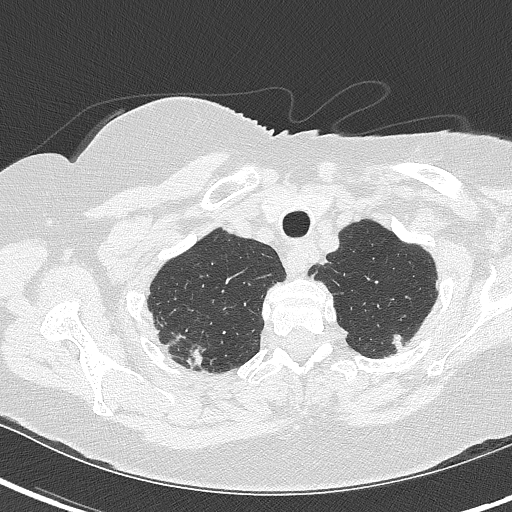
[im 288/302  lung]
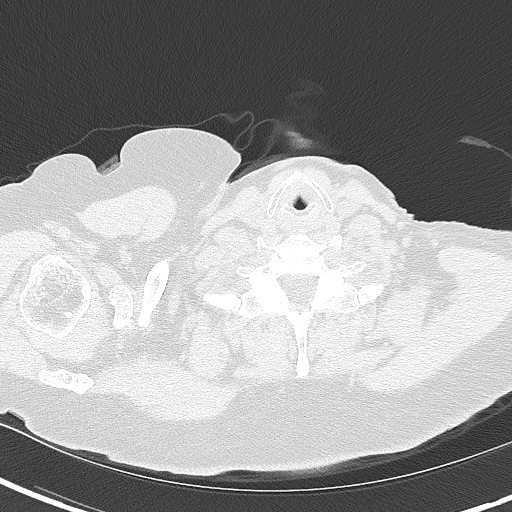

[Series 4: lcs f/u 1.00 cor · coronal · 0.57mm/px · 3 of 277 slices shown]
[im 56/277  lung]
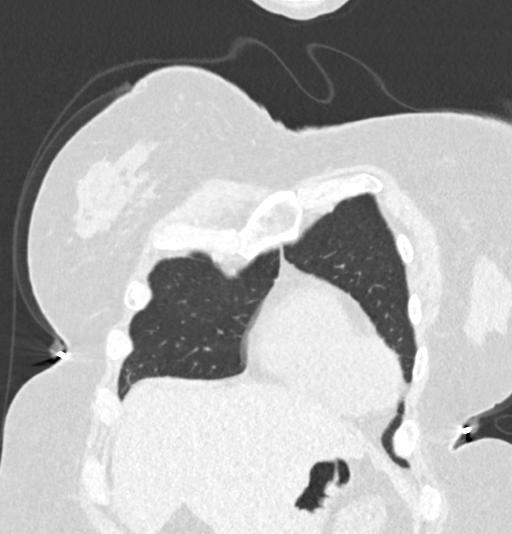
[im 111/277  lung]
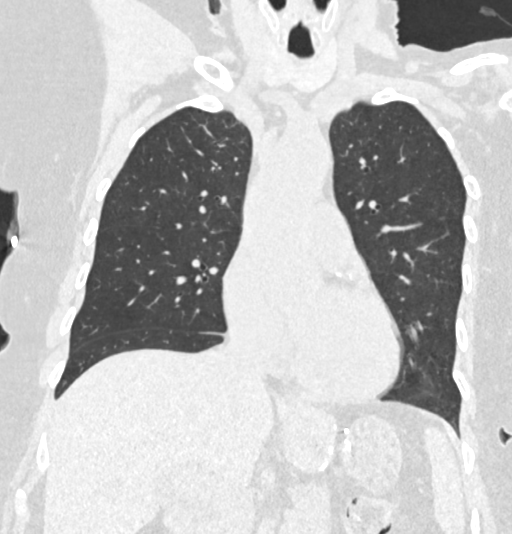
[im 166/277  lung]
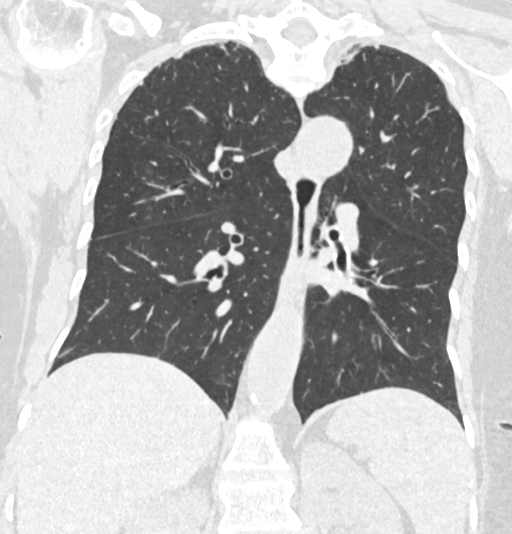

[14 of 40 positions shown; findings below may reference images not displayed]

FINDINGS: Cardiovascular: Heart size is normal. There is no significant
pericardial fluid, thickening or pericardial calcification. There is
aortic atherosclerosis, as well as atherosclerosis of the great
vessels of the mediastinum and the coronary arteries, including
calcified atherosclerotic plaque in the left main, left anterior
descending, left circumflex and right coronary arteries.

Mediastinum/Nodes: No pathologically enlarged mediastinal or hilar
lymph nodes. Please note that accurate exclusion of hilar adenopathy
is limited on noncontrast CT scans. Esophagus is unremarkable in
appearance. No axillary lymphadenopathy.

Lungs/Pleura: Previously noted nodule of concern in the left lower
lobe has completely resolved indicative of a benign infectious or
inflammatory nodule on the prior study. Other small pulmonary
nodules are noted in the lungs bilaterally, stable compared to the
prior study, largest of which is in the left upper lobe near the
apex where there is a subpleural nodule (axial image 43 of series
3), with a volume derived mean diameter of 6.2 mm. No other larger
more suspicious appearing pulmonary nodules or masses are noted. No
acute consolidative airspace disease. No pleural effusions. Diffuse
bronchial wall thickening with very mild centrilobular and
paraseptal emphysema. Bilateral apical nodular pleuroparenchymal
thickening and architectural distortion, most compatible with
chronic post infectious or inflammatory scarring.

Upper Abdomen: Aortic atherosclerosis. Status post cholecystectomy.
Postoperative changes associated with the stomach in the upper
abdomen, likely from prior Roux-en-Y gastric bypass.

Musculoskeletal: There are no aggressive appearing lytic or blastic
lesions noted in the visualized portions of the skeleton.
IMPRESSION: 1. Lung-RADS 2S, benign appearance or behavior. Continue annual
screening with low-dose chest CT without contrast in 12 months.
2. The "S" modifier above refers to potentially clinically
significant non lung cancer related findings. Specifically, there is
aortic atherosclerosis, in addition to left main and 3 vessel
coronary artery disease. Please note that although the presence of
coronary artery calcium documents the presence of coronary artery
disease, the severity of this disease and any potential stenosis
cannot be assessed on this non-gated CT examination. Assessment for
potential risk factor modification, dietary therapy or pharmacologic
therapy may be warranted, if clinically indicated.
3. Mild diffuse bronchial wall thickening with very mild
centrilobular and paraseptal emphysema; imaging findings suggestive
of underlying COPD.

Aortic Atherosclerosis (FVW2N-TUO.O) and Emphysema (FVW2N-DCW.4).

## 2021-07-20 ENCOUNTER — Telehealth: Payer: Self-pay | Admitting: Acute Care

## 2021-07-20 NOTE — Telephone Encounter (Signed)
Left message for pt to call back to schedule f/u lung screening CT scan.  ?
